# Patient Record
Sex: Female | Born: 1940 | Race: Black or African American | Hispanic: No | Marital: Married | Smoking: Never smoker
Health system: Southern US, Community
[De-identification: ages and names within clinical notes are randomized; demographics above are authoritative.]

## PROBLEM LIST (undated history)

## (undated) DIAGNOSIS — E119 Type 2 diabetes mellitus without complications: Secondary | ICD-10-CM

## (undated) DIAGNOSIS — Z972 Presence of dental prosthetic device (complete) (partial): Secondary | ICD-10-CM

## (undated) DIAGNOSIS — I10 Essential (primary) hypertension: Secondary | ICD-10-CM

## (undated) HISTORY — PX: CHOLECYSTECTOMY: SHX55

## (undated) HISTORY — PX: ABDOMINAL HYSTERECTOMY: SHX81

---

## 2005-02-08 ENCOUNTER — Emergency Department: Payer: Self-pay | Admitting: Emergency Medicine

## 2006-12-02 IMAGING — CR RIGHT ANKLE - COMPLETE 3+ VIEW
1 series · 4 of 4 positions shown · non-contrast
Comparison: none

REASON FOR EXAM: Stepped into hole, twisted ankle
COMMENTS:  LMP: Post Hysterectomy

[Series 1: view not recorded · 0.17mm/px · 4 of 4 slices shown]
[im 1/4]
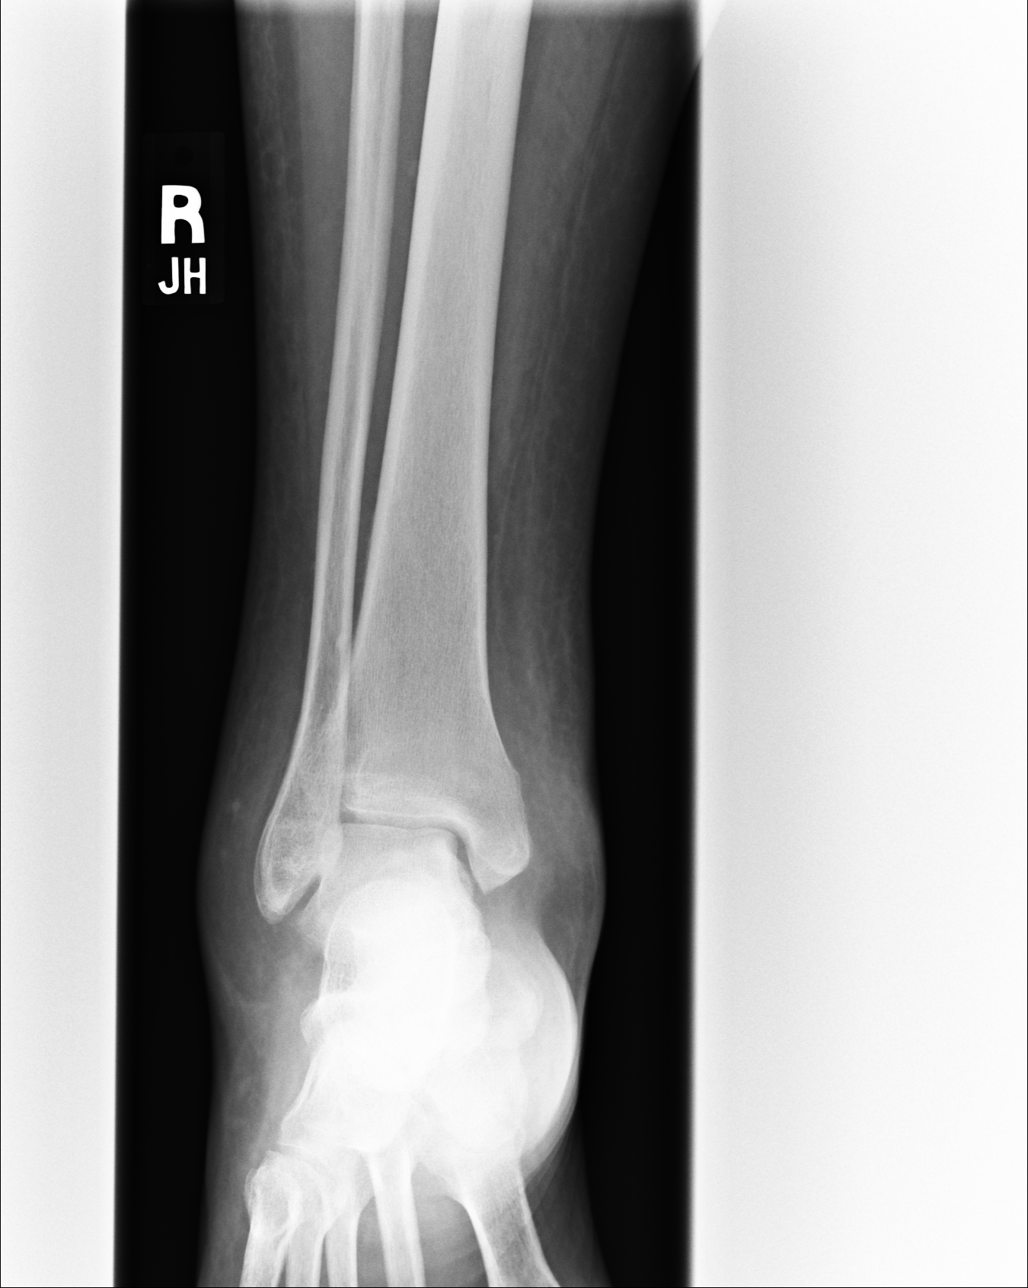
[im 2/4]
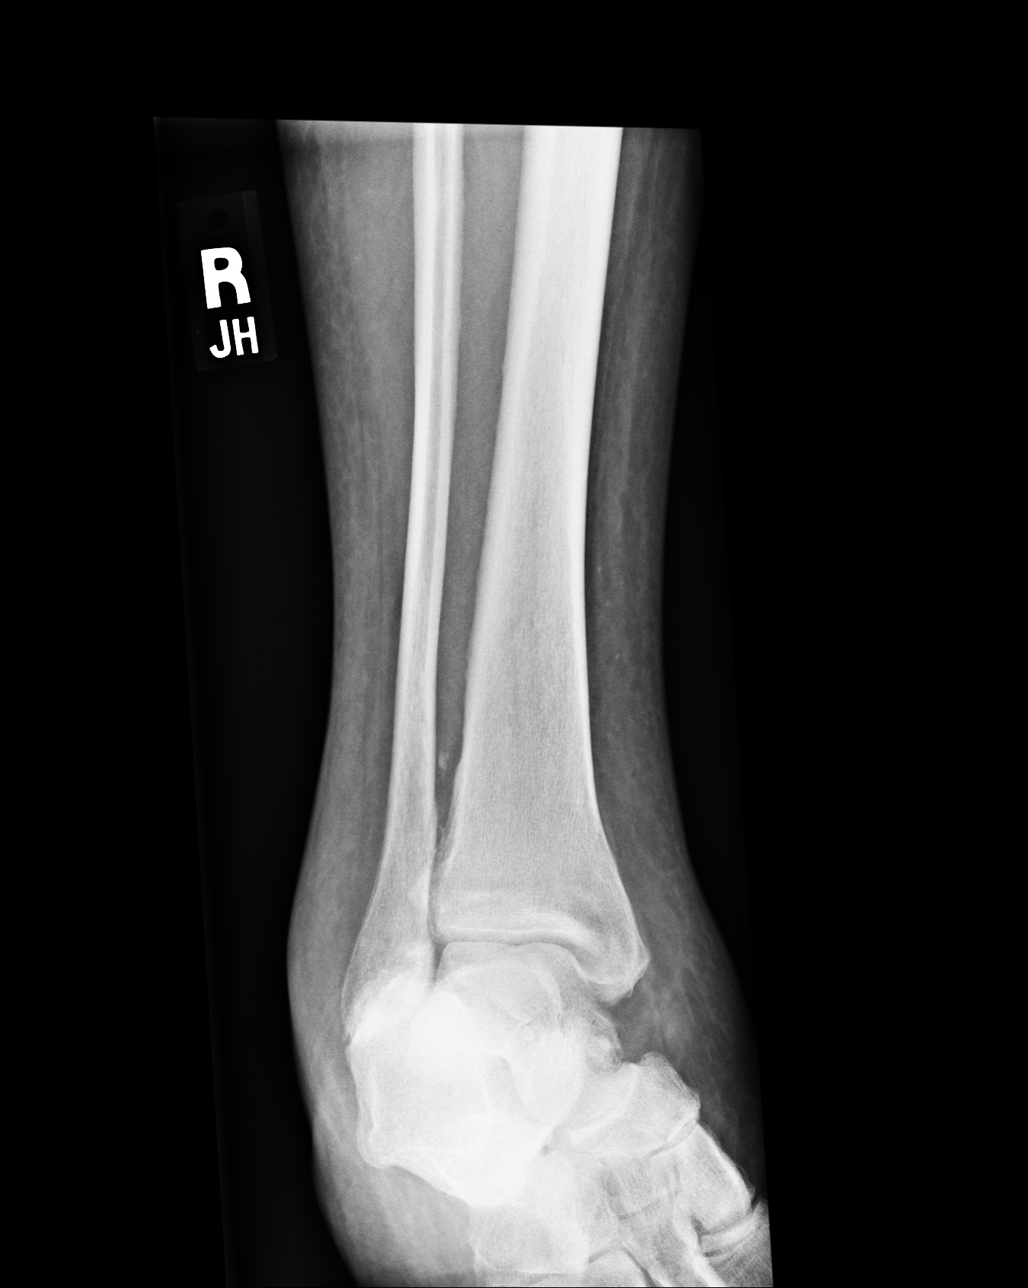
[im 3/4]
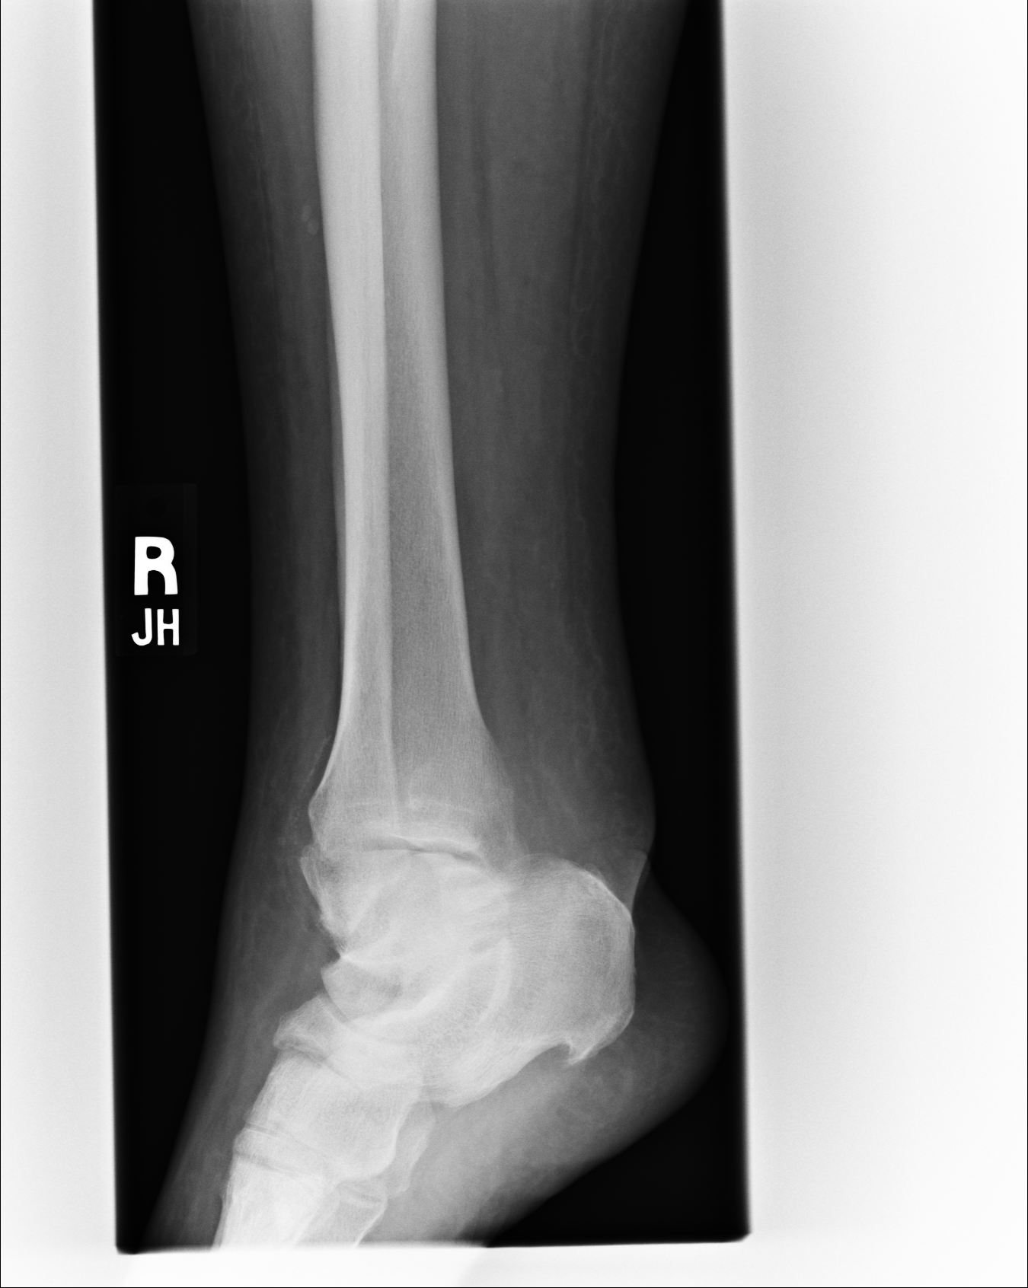
[im 4/4]
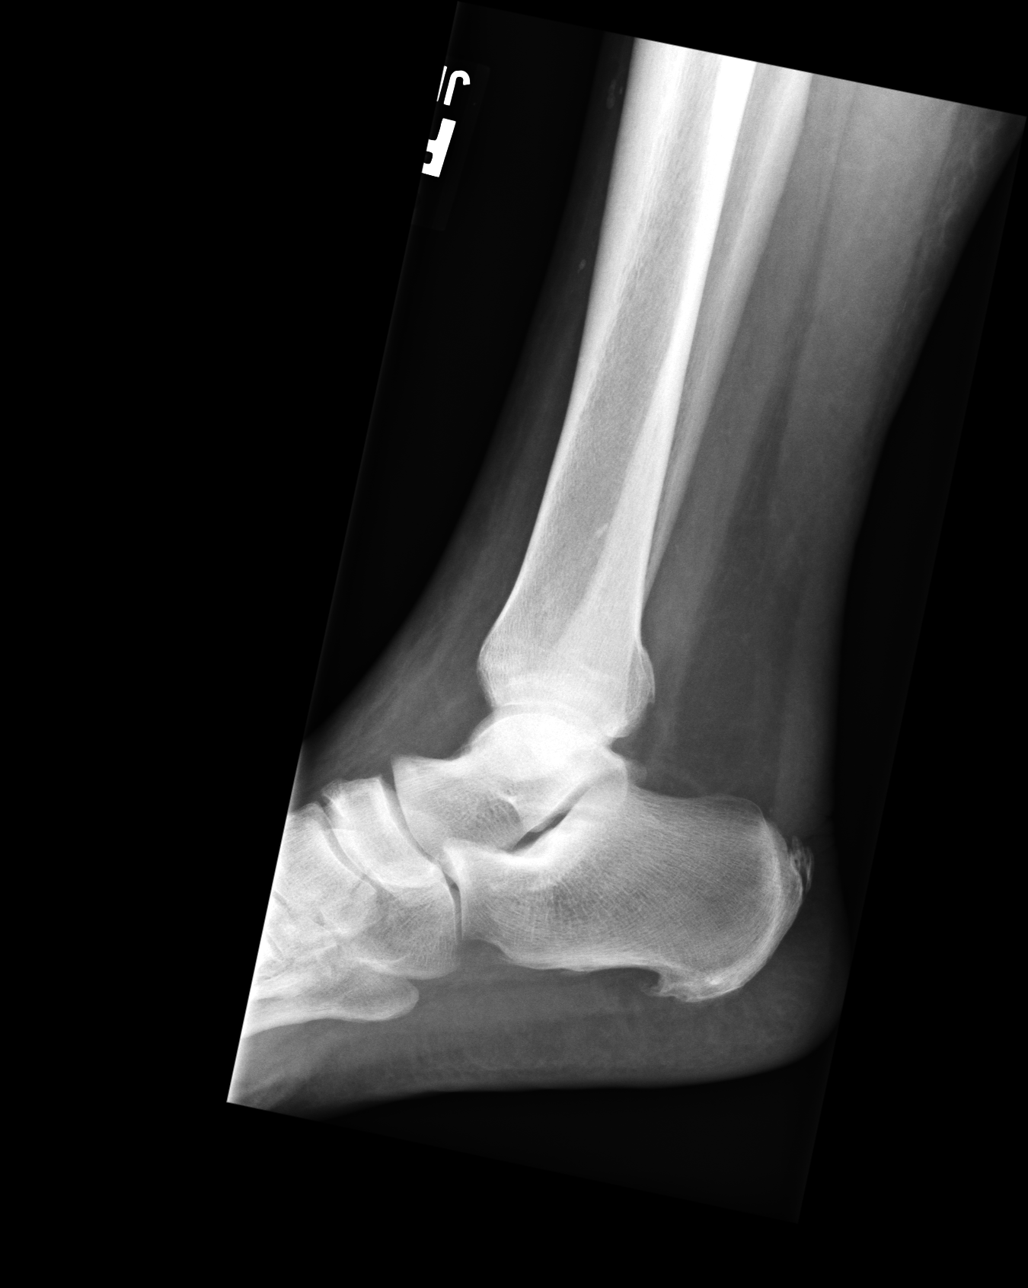

[4 of 4 positions shown; findings below may reference images not displayed]

PROCEDURE:     DXR - DXR ANKLE RIGHT COMPLETE  - February 08, 2005  [DATE]

RESULT:     Multiple views of the RIGHT ankle show significant soft tissue
swelling medially and laterally.  There is degenerative spurring at the
Achilles and plantar insertions on the calcaneus.  A definite fracture is
not identified.
IMPRESSION: As above.

## 2010-10-18 ENCOUNTER — Emergency Department: Payer: Self-pay | Admitting: Emergency Medicine

## 2012-08-10 IMAGING — CR DG CHEST 1V PORT
1 series · 1 of 1 positions shown · non-contrast
Comparison: none

REASON FOR EXAM: EPIGASTRIC PAIN
COMMENTS:

PROCEDURE:     DXR - DXR PORTABLE CHEST SINGLE VIEW  - October 18, 2010 [DATE]
RESULT:     The lungs are adequately inflated and clear. The cardiac
silhouette is normal in size. The pulmonary vascularity is not engorged.
There is no pleural effusion. The mediastinum is normal in width.

[view not recorded]
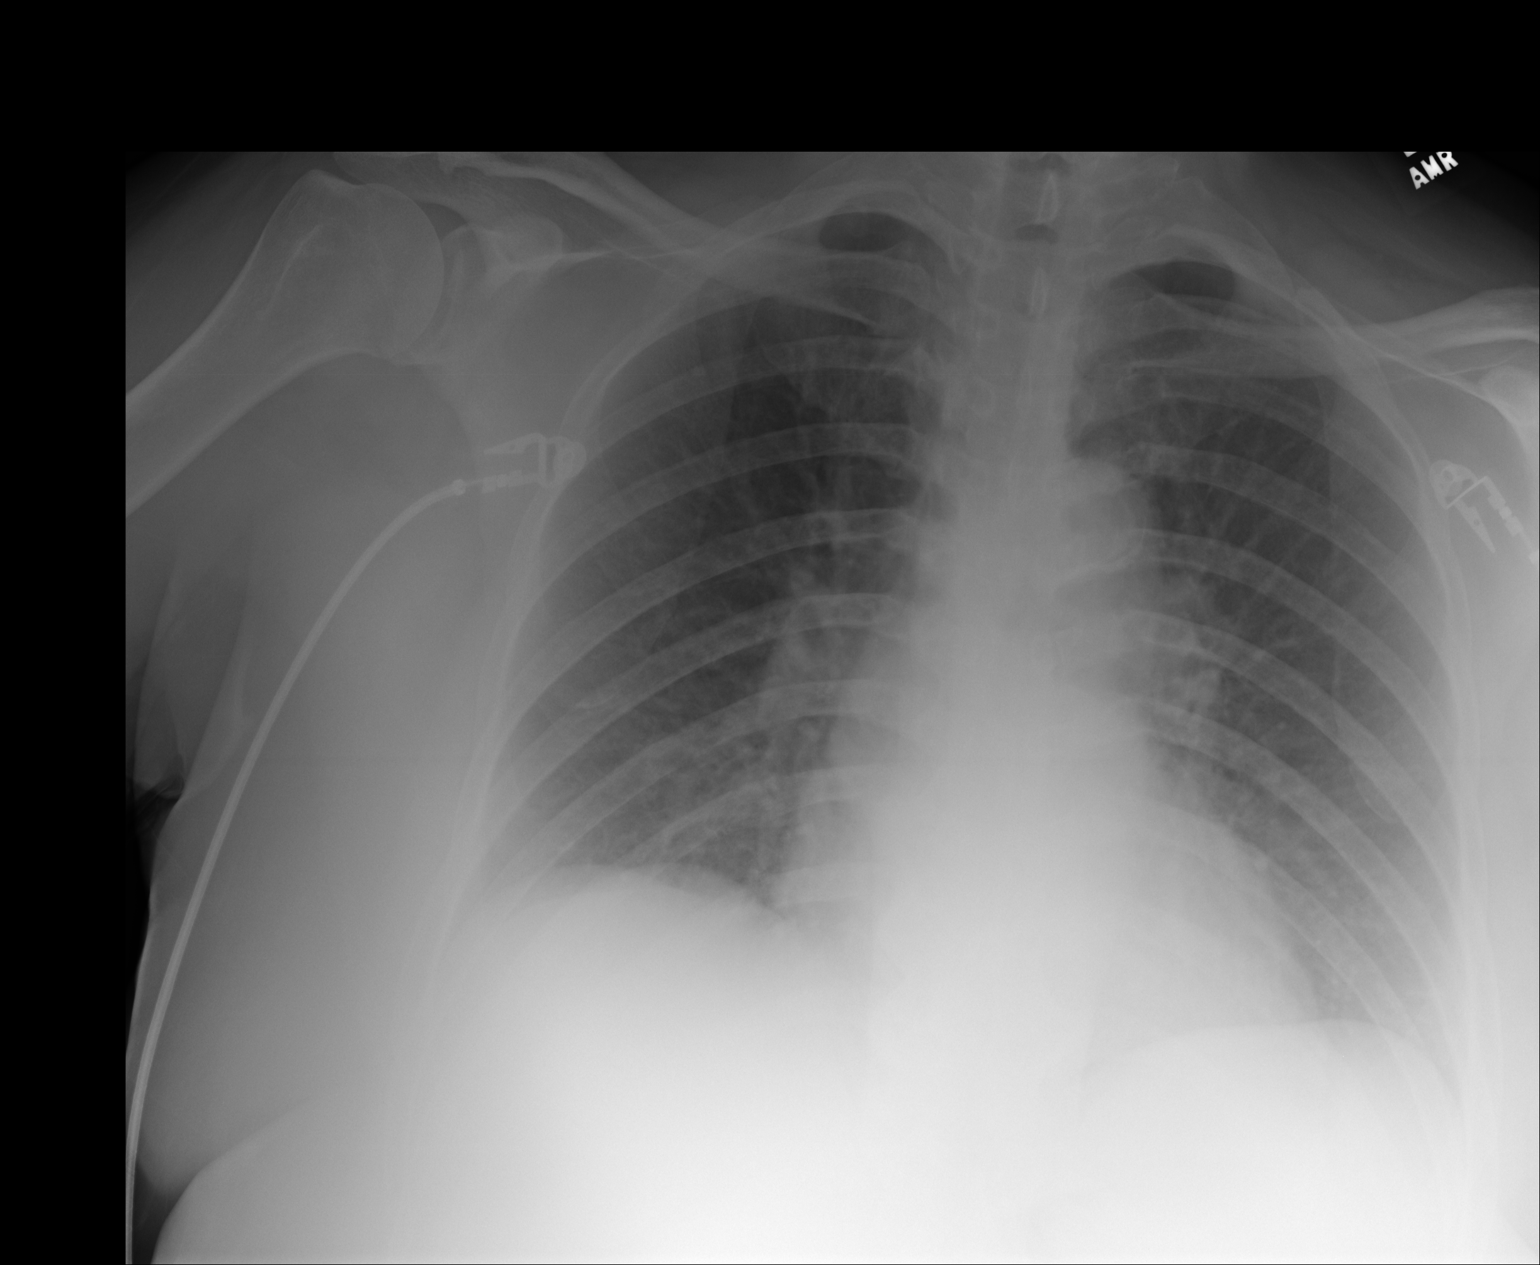

[1 of 1 positions shown; findings below may reference images not displayed]

IMPRESSION: I do not see evidence of acute cardiopulmonary abnormality.

## 2012-08-10 IMAGING — US ABDOMEN ULTRASOUND
1 series · 17 of 25 positions shown · non-contrast
Comparison: none

REASON FOR EXAM: ruq pain
COMMENTS:

[Series 1: abdomen ultrasound · 17 of 91 slices shown]
[im 1/91]
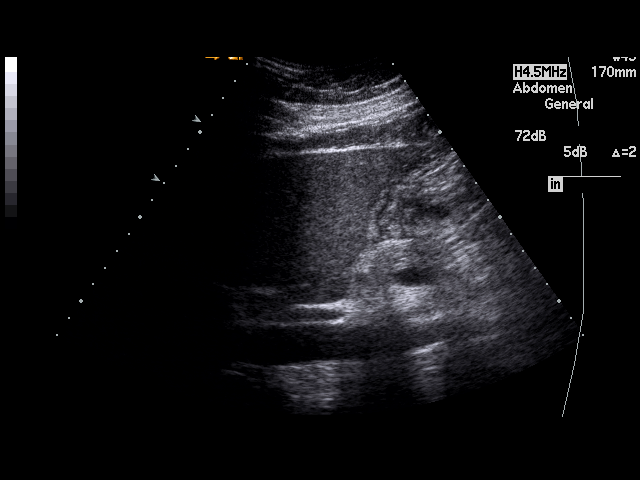
[im 8/91]
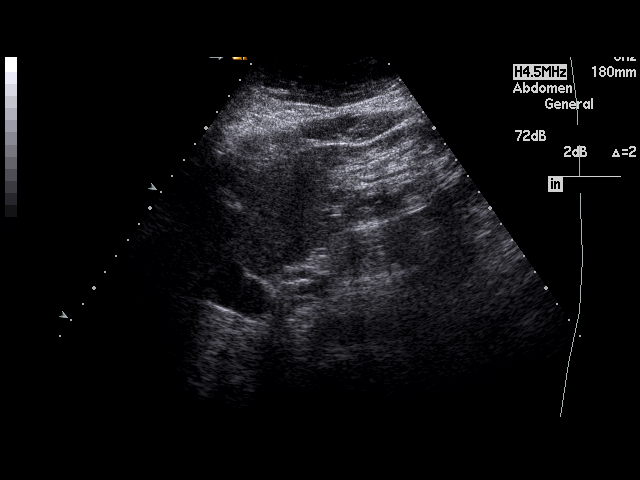
[im 12/91]
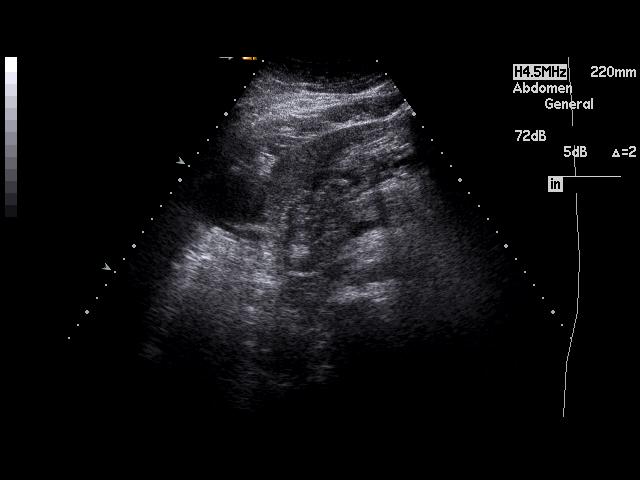
[im 19/91]
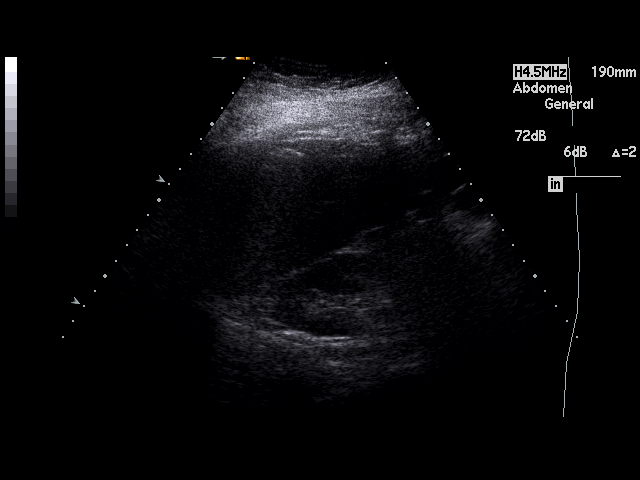
[im 23/91]
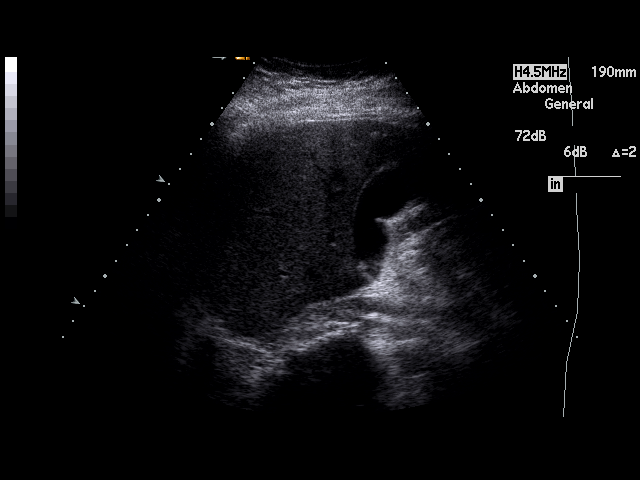
[im 31/91]
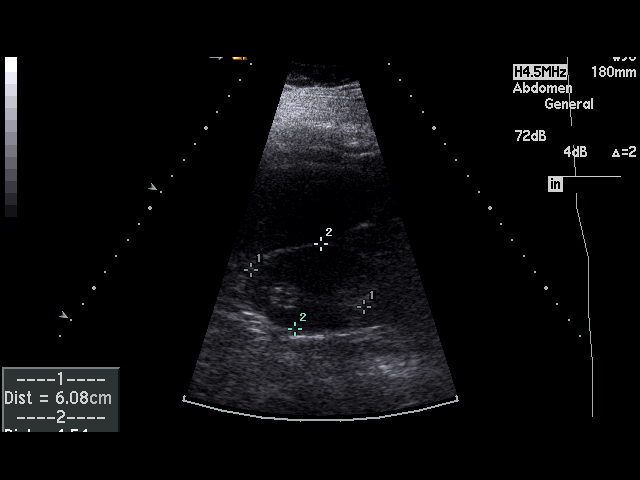
[im 34/91]
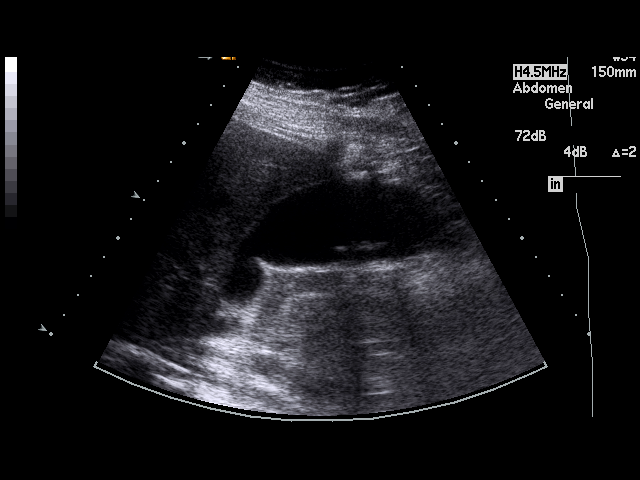
[im 42/91]
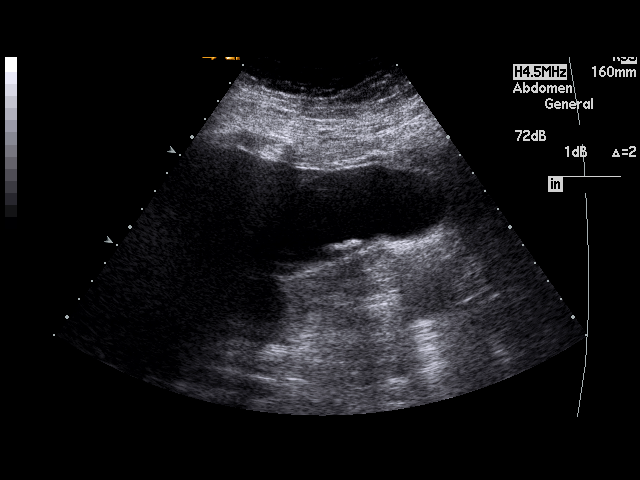
[im 46/91]
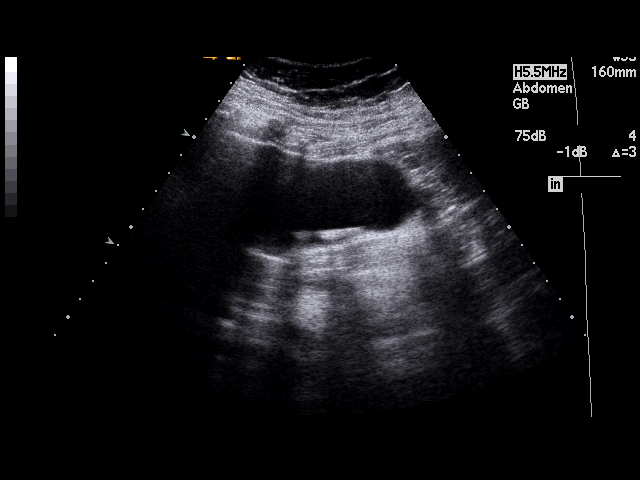
[im 49/91]
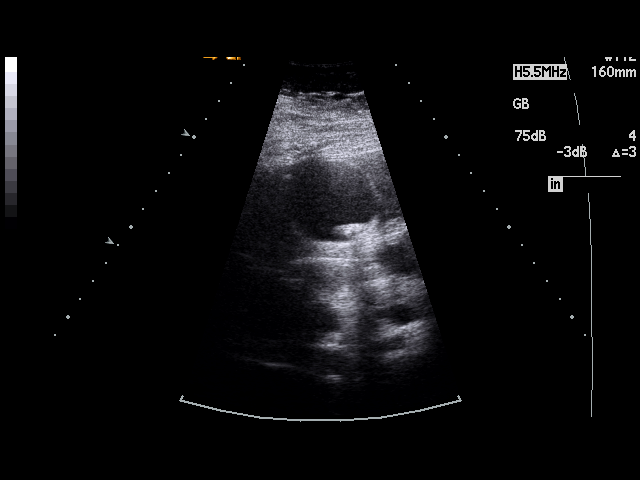
[im 57/91]
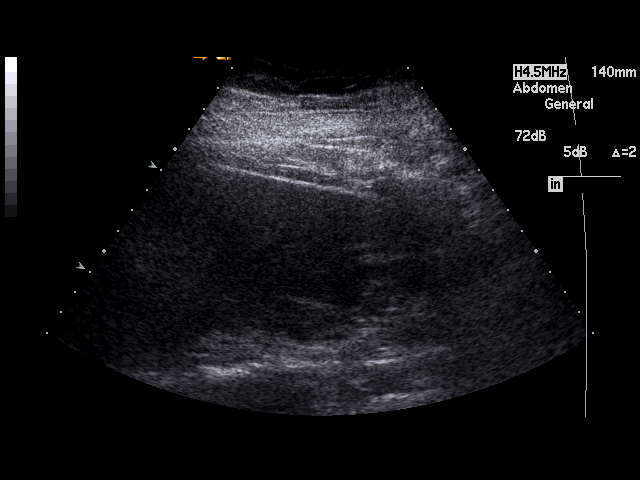
[im 61/91]
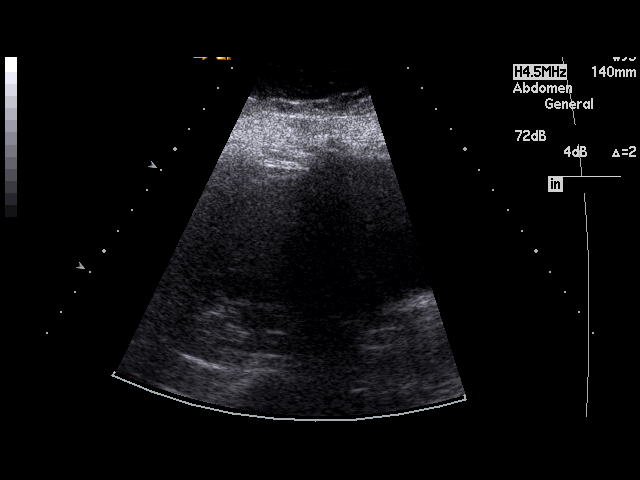
[im 68/91]
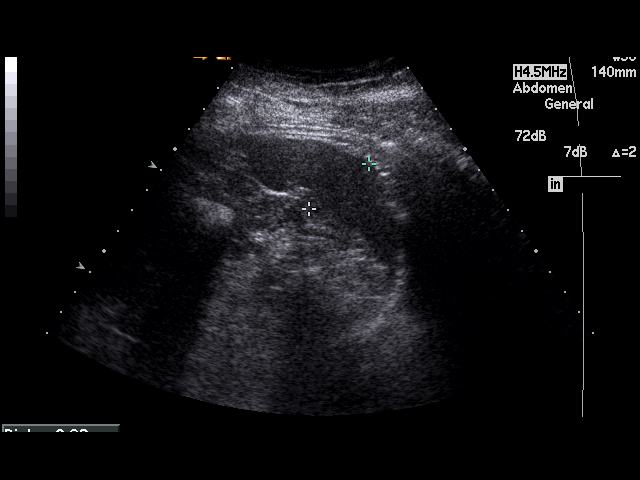
[im 72/91]
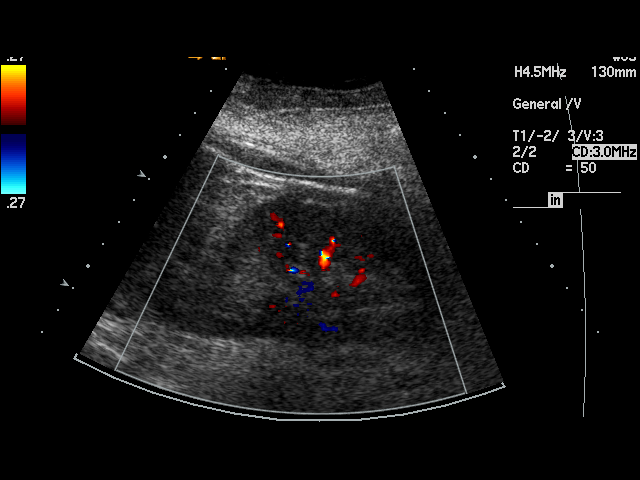
[im 79/91]
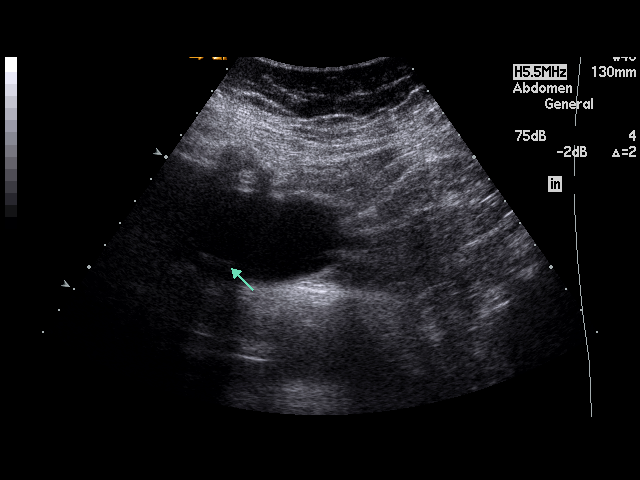
[im 83/91]
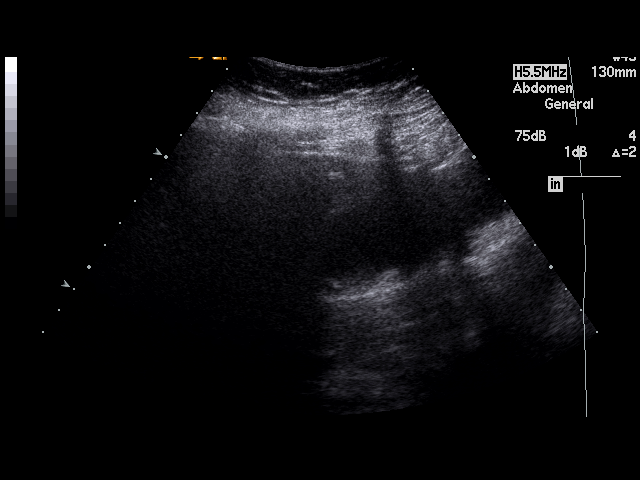
[im 91/91]
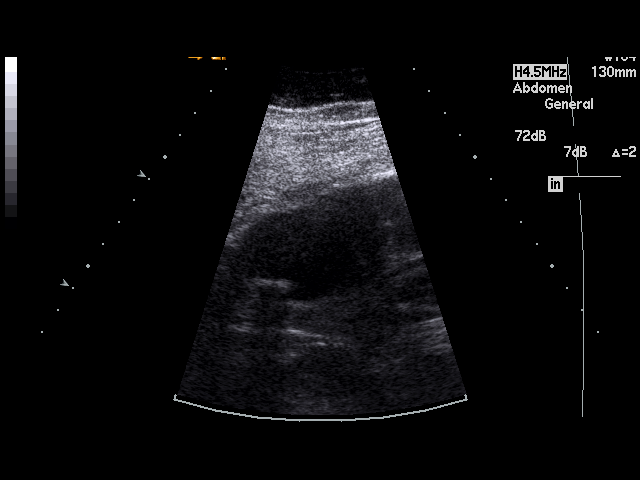

[17 of 25 positions shown; findings below may reference images not displayed]

PROCEDURE:     US  - US ABDOMEN GENERAL SURVEY  - October 18, 2010 [DATE]

RESULT:     The liver exhibits normal echotexture with no focal mass nor
ductal dilation. Portal venous flow is normal in direction toward the liver.
The gallbladder is adequately distended and contains echogenic layering
shadowing material consistent with stones or sludge. There is no positive
sonographic Murphy's sign. There is no gallbladder wall thickening or
pericholecystic fluid. The common bile duct is borderline dilated at 7.4 mm.

Evaluation the pancreas and abdominal aorta is limited due to bowel gas. The
spleen, inferior vena cava, and kidneys are normal in appearance.
IMPRESSION: 1. There are gallstones versus sludge within the gallbladder. There is no
positive sonographic Murphy's sign. The common bile duct is borderline
dilated at 7.4 mm in diameter. There is no intrahepatic ductal dilation.
2. The pancreas could not be adequately evaluated due to the presence of
bowel gas.
3. I see no acute abnormality of the liver or spleen or kidneys.

## 2013-02-25 ENCOUNTER — Ambulatory Visit: Payer: Self-pay | Admitting: Geriatric Medicine

## 2013-03-07 ENCOUNTER — Ambulatory Visit: Payer: Self-pay | Admitting: Geriatric Medicine

## 2013-04-06 ENCOUNTER — Ambulatory Visit: Payer: Self-pay | Admitting: Geriatric Medicine

## 2017-09-17 ENCOUNTER — Other Ambulatory Visit: Payer: Self-pay

## 2017-09-17 ENCOUNTER — Emergency Department: Payer: Medicare Other

## 2017-09-17 ENCOUNTER — Emergency Department
Admission: EM | Admit: 2017-09-17 | Discharge: 2017-09-17 | Disposition: A | Discharge status: Home / Self Care | Payer: Medicare Other | Attending: Emergency Medicine | Admitting: Emergency Medicine

## 2017-09-17 DIAGNOSIS — R079 Chest pain, unspecified: Secondary | ICD-10-CM | POA: Diagnosis present

## 2017-09-17 DIAGNOSIS — Z7982 Long term (current) use of aspirin: Secondary | ICD-10-CM | POA: Insufficient documentation

## 2017-09-17 DIAGNOSIS — K219 Gastro-esophageal reflux disease without esophagitis: Secondary | ICD-10-CM | POA: Diagnosis not present

## 2017-09-17 DIAGNOSIS — Z794 Long term (current) use of insulin: Secondary | ICD-10-CM | POA: Insufficient documentation

## 2017-09-17 DIAGNOSIS — Z79899 Other long term (current) drug therapy: Secondary | ICD-10-CM | POA: Diagnosis not present

## 2017-09-17 DIAGNOSIS — R739 Hyperglycemia, unspecified: Secondary | ICD-10-CM

## 2017-09-17 DIAGNOSIS — R0789 Other chest pain: Secondary | ICD-10-CM | POA: Diagnosis not present

## 2017-09-17 DIAGNOSIS — E1165 Type 2 diabetes mellitus with hyperglycemia: Secondary | ICD-10-CM | POA: Diagnosis not present

## 2017-09-17 DIAGNOSIS — I10 Essential (primary) hypertension: Secondary | ICD-10-CM | POA: Diagnosis not present

## 2017-09-17 DIAGNOSIS — R001 Bradycardia, unspecified: Secondary | ICD-10-CM | POA: Diagnosis not present

## 2017-09-17 HISTORY — DX: Type 2 diabetes mellitus without complications: E11.9

## 2017-09-17 HISTORY — DX: Essential (primary) hypertension: I10

## 2017-09-17 LAB — BASIC METABOLIC PANEL
ANION GAP: 9 (ref 5–15)
BUN: 26 mg/dL — ABNORMAL HIGH (ref 6–20)
CO2: 22 mmol/L (ref 22–32)
Calcium: 9.3 mg/dL (ref 8.9–10.3)
Chloride: 108 mmol/L (ref 101–111)
Creatinine, Ser: 1.08 mg/dL — ABNORMAL HIGH (ref 0.44–1.00)
GFR calc Af Amer: 57 mL/min — ABNORMAL LOW (ref >60)
GFR, EST NON AFRICAN AMERICAN: 49 mL/min — AB (ref >60)
GLUCOSE: 185 mg/dL — AB (ref 65–99)
POTASSIUM: 5 mmol/L (ref 3.5–5.1)
Sodium: 139 mmol/L (ref 135–145)

## 2017-09-17 LAB — HEPATIC FUNCTION PANEL
ALK PHOS: 88 U/L (ref 38–126)
ALT: 11 U/L — AB (ref 14–54)
AST: 19 U/L (ref 15–41)
Albumin: 4.3 g/dL (ref 3.5–5.0)
Bilirubin, Direct: <0.1 mg/dL — ABNORMAL LOW (ref 0.1–0.5)
TOTAL PROTEIN: 7.9 g/dL (ref 6.5–8.1)
Total Bilirubin: 0.9 mg/dL (ref 0.3–1.2)

## 2017-09-17 LAB — CBC
HEMATOCRIT: 37 % (ref 35.0–47.0)
HEMOGLOBIN: 12.1 g/dL (ref 12.0–16.0)
MCH: 28.8 pg (ref 26.0–34.0)
MCHC: 32.8 g/dL (ref 32.0–36.0)
MCV: 87.8 fL (ref 80.0–100.0)
Platelets: 187 10*3/uL (ref 150–440)
RBC: 4.22 MIL/uL (ref 3.80–5.20)
RDW: 13.9 % (ref 11.5–14.5)
WBC: 7.5 10*3/uL (ref 3.6–11.0)

## 2017-09-17 LAB — TROPONIN I: Troponin I: <0.03 ng/mL (ref <0.03)

## 2017-09-17 LAB — LIPASE, BLOOD: Lipase: 37 U/L (ref 11–51)

## 2017-09-17 MED ORDER — SODIUM CHLORIDE 0.9 % IV BOLUS (SEPSIS)
1000.0000 mL | Freq: Once | INTRAVENOUS | Status: AC
  Administered 2017-09-17: 1000 mL via INTRAVENOUS

## 2017-09-17 MED ORDER — GI COCKTAIL ~~LOC~~
30.0000 mL | Freq: Once | ORAL | Status: AC
  Administered 2017-09-17: 30 mL via ORAL
  Filled 2017-09-17: qty 30

## 2017-09-17 NOTE — Discharge Instructions (Addendum)
You are evaluated for burping and belching and abdominal/chest discomfort, and although no certain cause was found, I am most suspicious of acid reflux also called GERD.    As we discussed, your exam and evaluation are overall reassuring as far as the heart, but I am recommending that you follow-up with a cardiologist for evaluation and consideration of a possible outpatient stress test.  We also discussed, heart rate was slow with some irregularity, and after discussion with cardiologist, your placed with a monitor.  Follow-up with Dr. Windell HummingbirdGollan's office for monitor results.  Return to emergency department immediately for any worsening or uncontrolled abdominal pain, chest pain, dizziness, passing out, weakness, numbness, confusion or altered mental status, fever, black or bloody stool, or any other symptoms concerning to you.  You may try over-the-counter Maalox, use as directed for immediate symptoms.  You may try over-the-counter Prilosec 20 mg daily for 1-2 weeks to help with stomach acid.

## 2017-09-17 NOTE — ED Provider Notes (Signed)
[facility] Emergency Department Provider Note ____________________________________________   I have reviewed the triage vital signs and the triage nursing note.  HISTORY  Chief Complaint Chest Pain   Historian Patient and spouse  HPI Toni Moran is a 77 y.o. female with a history of hypertension and diabetes for which she takes amlodipine, losartan, as well as insulin, presents today with a history of indigestion, burping and belching and left under the breast discomfort which is worse with laying on to the right side since Saturday when she ate a pasta salad.  States that symptoms have been intermittent, worse on Saturday and Sunday, better but not completely gone now.  It is somewhat positional.  She has not had a fever nor cough or congestion.  No palpitations.  Pain at times is moderate.  States that she told her daughter about this and was told that she should go to be evaluated. History of cholecystectomy.   Past Medical History:  Diagnosis Date  . Diabetes mellitus without complication (HCC)   . Hypertension     There are no active problems to display for this patient.   Past Surgical History:  Procedure Laterality Date  . ABDOMINAL HYSTERECTOMY    . CHOLECYSTECTOMY      Prior to Admission medications   Medication Sig Start Date End Date Taking? Authorizing Provider  amLODipine (NORVASC) 2.5 MG tablet Take 1 tablet by mouth daily. 07/30/17  Yes [provider]  aspirin EC 81 MG tablet Take 81 mg by mouth daily.   Yes [provider]  guanFACINE (TENEX) 1 MG tablet Take 2 mg by mouth at bedtime. 08/24/17  Yes [provider]  hydrochlorothiazide (HYDRODIURIL) 25 MG tablet Take 1 tablet by mouth daily. 07/14/17  Yes [provider]  insulin glargine (LANTUS) 100 UNIT/ML injection Inject 15 Units into the skin daily.   Yes [provider]  insulin NPH Human (HUMULIN N) 100 UNIT/ML injection  Inject 45 Units into the skin daily. 03/27/16  Yes [provider]  loperamide (IMODIUM) 2 MG capsule Take by mouth as needed for diarrhea or loose stools.   Yes [provider]  losartan (COZAAR) 100 MG tablet Take 1 tablet by mouth daily. 09/05/17  Yes [provider]  metFORMIN (GLUCOPHAGE) 1000 MG tablet Take 1 tablet by mouth 2 (two) times daily. 07/12/17  Yes [provider]  simvastatin (ZOCOR) 20 MG tablet Take 1 tablet by mouth daily. 06/20/17  Yes [provider]  spironolactone (ALDACTONE) 50 MG tablet Take 1 tablet by mouth daily. 09/05/17  Yes [provider]  amlodipine 2.5mg   No Known Allergies  No family history on file.  Social History Social History   Tobacco Use  . Smoking status: Never Smoker  . Smokeless tobacco: Never Used  Substance Use Topics  . Alcohol use: No    Frequency: Never  . Drug use: No    Review of Systems  Constitutional: Negative for fever. Eyes: Negative for visual changes. ENT: Negative for sore throat. Cardiovascular: Left under the breast pain just along the rib margin, unclear if it is actually in the abdomen itself. Respiratory: Negative for shortness of breath. Gastrointestinal: Negative for abdominal pain, vomiting and diarrhea. Genitourinary: Negative for dysuria. Musculoskeletal: Negative for back pain. Skin: Negative for rash. Neurological: Negative for headache.  ____________________________________________   PHYSICAL EXAM:  VITAL SIGNS: ED Triage Vitals [09/17/17 1052]  Enc Vitals Group     BP (!) 200/47  Pulse Rate 96     Resp 18     Temp 97.7 F (36.5 C)     Temp Source Oral     SpO2 100 %     Weight 201 lb (91.2 kg)     Height 5\' 7"  (1.702 m)     Head Circumference      Peak Flow      Pain Score      Pain Loc      Pain Edu?      Excl. in GC?      Constitutional: Alert and oriented. Well appearing and in no distress. HEENT   Head: Normocephalic and  atraumatic.      Eyes: Conjunctivae are normal. Pupils equal and round.       Ears:         Nose: No congestion/rhinnorhea.   Mouth/Throat: Mucous membranes are moist.   Neck: No stridor. Cardiovascular/Chest: Normal rate, regular rhythm.  No murmurs, rubs, or gallops. Respiratory: Normal respiratory effort without tachypnea nor retractions. Breath sounds are clear and equal bilaterally. No wheezes/rales/rhonchi. Gastrointestinal: Soft. No distention, no guarding, no rebound. Nontender.    Genitourinary/rectal:Deferred Musculoskeletal: Nontender with normal range of motion in all extremities. No joint effusions.  No lower extremity tenderness. 1+ lower extremity edema bilateral lower extreme is without any calf tenderness. Neurologic:  Normal speech and language. No gross or focal neurologic deficits are appreciated. Skin:  Skin is warm, dry and intact. No rash noted. Psychiatric: Mood and affect are normal. Speech and behavior are normal. Patient exhibits appropriate insight and judgment.   ____________________________________________  LABS (pertinent positives/negatives) I, Governor Rooksebecca Jacobie Stamey, MD the attending physician have reviewed the labs noted below.  Labs Reviewed  BASIC METABOLIC PANEL - Abnormal; Notable for the following components:      Result Value   Glucose, Bld 185 (*)    BUN 26 (*)    Creatinine, Ser 1.08 (*)    GFR calc non Af Amer 49 (*)    GFR calc Af Amer 57 (*)    All other components within normal limits  HEPATIC FUNCTION PANEL - Abnormal; Notable for the following components:   ALT 11 (*)    Bilirubin, Direct <0.1 (*)    All other components within normal limits  CBC  TROPONIN I  LIPASE, BLOOD    ____________________________________________    EKG I, Governor Rooksebecca Nariah Morgano, MD, the attending physician have personally viewed and interpreted all ECGs.  46 bpm, uncertain rhythm, appears to be normal sinus rhythm with Weinckebach.  Bradycardic.  Narrow qrs.   Normal st and t wave.  Rhythm strip 12:10.  Normal sinus rhythm with an degree AV block Herbie SaxonWenke Bach.  Rhythm strip 13:0 1 normal sinus rhythm, sinus bradycardia with sinus arrhythmia  EKG #2: 3:11 19  44 bpm.  Sinus rhythm with sinus pauses, sinus arrhythmia.  Naris rest were normal axis.  Normal ST and T wave.  EKG #3  3:11 48 49 bpm.  Normal sinus rhythm, sinus bradycardia.  No acute respiratory normal axis.  Normal ST and T wave ____________________________________________  RADIOLOGY   Chest 2 view: negative Radiologist interp:  IMPRESSION: No acute cardiopulmonary disease. __________________________________________  PROCEDURES  Procedure(s) performed: None  Critical Care performed: None   ____________________________________________  ED COURSE / ASSESSMENT AND PLAN  Pertinent labs & imaging results that were available during my care of the patient were reviewed by me and considered in my medical decision making (see chart for details).   Clinically  most suspicious for gastritis/GERD.  Interestingly she does report some sort of positional component that when she is lying on her right side she can feel a pulling in her left lower chest/upper abdomen.  Uncertain etiology of this.  Does not sound cardiac.  However, given location will go ahead and evaluate with laboratory studies as well.  Patient states that she is been told that she has a low heart rate.  Her initial EKG shows likely winky block, and on the monitor in with her, it looks like just normal sinus rhythm with no irregularity.  Heart rate into the 60s in the room on the monitor.  Given her evaluation is overall reassuring, discussed with her okay for discharge home, plan to add over-the-counter Maalox for intermittent symptoms, as well as Prilosec for 1 week.  I am recommending that she follow with a cardiologist out of concern of risk factors for ACS, and nonspecific symptoms.  I had also added on hepatic  function panel and lipase which are all reassuring.  DIFFERENTIAL DIAGNOSIS: Differential diagnosis includes, but is not limited to, ACS, aortic dissection, pulmonary embolism, cardiac tamponade, pneumothorax, pneumonia, pericarditis, myocarditis, GI-related causes including esophagitis/gastritis, and musculoskeletal chest wall pain.    CONSULTATIONS:   Dr. Mariah Milling, cardiology, he reviewed the EKGs together and no indication for emergency admission, he is recommending Holter monitor and he will follow up the results and follow-up with the patient.  Again patient is asymptomatic from this perspective.   Patient / Family / Caregiver informed of clinical course, medical decision-making process, and agree with plan.   I discussed return precautions, follow-up instructions, and discharge instructions with patient and/or family.  Discharge Instructions : You are evaluated for burping and belching and abdominal/chest discomfort, and although no certain cause was found, I am most suspicious of acid reflux also called GERD.    As we discussed, your exam and evaluation are overall reassuring as far as the heart, but I am recommending that you follow-up with a cardiologist for evaluation and consideration of a possible outpatient stress test.  We also discussed, heart rate was slow with some irregularity, and after discussion with cardiologist, your placed with a monitor.  Follow-up with Dr. Windell Hummingbird office for monitor results.  Return to emergency department immediately for any worsening or uncontrolled abdominal pain, chest pain, dizziness, passing out, weakness, numbness, confusion or altered mental status, fever, black or bloody stool, or any other symptoms concerning to you.  You may try over-the-counter Maalox, use as directed for immediate symptoms.  You may try over-the-counter Prilosec 20 mg daily for 1-2 weeks to help with stomach acid.     ___________________________________________   FINAL  CLINICAL IMPRESSION(S) / ED DIAGNOSES   Final diagnoses:  Atypical chest pain  Gastroesophageal reflux disease, esophagitis presence not specified  Hyperglycemia  Essential hypertension  Bradycardia      ___________________________________________        Note: This dictation was prepared with Dragon dictation. Any transcriptional errors that result from this process are unintentional    Governor Rooks, MD 09/17/17 [phone]

## 2017-09-17 NOTE — ED Notes (Signed)
CALLED  RESPIRATORY  FOR  HOLTER  MONITOR  PER  DR  Shaune PollackLORD  MD

## 2017-09-17 NOTE — ED Triage Notes (Signed)
Pt c/o left sided chest pain since Saturday evening and states she has not been able to lay down with out the pain worsening . Denies SOB/N/v..Marland Kitchen

## 2017-09-17 NOTE — ED Notes (Signed)
Pt reports eating pasta salad a few days ago and having several episodes of belching and indigestion.  Pt reports that since she has not been able to lay flat on one side or the other without having pain under her breast.  Pt reports that she has some relief with an antacid.

## 2017-09-17 NOTE — ED Notes (Signed)
Pt discharged to home.  Family member driving.  Discharge instructions reviewed.  Verbalized understanding.  No questions or concerns at this time.  Teach back verified.  Pt in NAD.  No items left in ED.   

## 2017-09-18 ENCOUNTER — Telehealth: Payer: Self-pay

## 2017-09-18 NOTE — Telephone Encounter (Signed)
Pt calling to let us know [facility] will [facility] be needing [facility] services [facility], [facility]  Nothing else needed.

## 2017-09-24 ENCOUNTER — Ambulatory Visit (INDEPENDENT_AMBULATORY_CARE_PROVIDER_SITE_OTHER): Payer: Medicare Other

## 2017-09-24 DIAGNOSIS — R001 Bradycardia, unspecified: Secondary | ICD-10-CM

## 2017-11-10 ENCOUNTER — Telehealth: Payer: Self-pay | Admitting: *Deleted

## 2017-11-10 NOTE — Telephone Encounter (Signed)
-----   Message from Clide Dales sent at 11/09/2017  9:33 AM EDT ----- Regarding: monitor results Contact: [phone] Patient called after receiving statement for monitor placed in [facility] ER.  Dr. Mariah Milling read the results.  Patient states that she has never received a call regarding the results.  Please reach out to her regarding this.  Thank you! [facility] Billing Dept

## 2017-11-10 NOTE — Telephone Encounter (Signed)
Spoke with patient and let her know that results will be sent back to her provider and they should review with her. She was very appreciative for the call and states that she has upcoming appointment with them. She verbalized understanding with no further questions at this time.

## 2018-05-28 ENCOUNTER — Encounter: Payer: Medicare Other | Attending: Geriatric Medicine | Admitting: Dietician

## 2018-05-28 DIAGNOSIS — E119 Type 2 diabetes mellitus without complications: Secondary | ICD-10-CM | POA: Diagnosis not present

## 2018-05-28 DIAGNOSIS — Z794 Long term (current) use of insulin: Secondary | ICD-10-CM

## 2018-05-28 NOTE — Progress Notes (Signed)
Medical Nutrition Therapy: Visit start time: 1030  end time: 1145  Assessment:  Diagnosis: Type 2 diabetes Past medical history: HTN, bradycardia Psychosocial issues/ stress concerns: none  Preferred learning method:  . Auditory . Visual   Current weight: 191.1lbs Height: 5'7" Medications, supplements: reconciled list in medical record  Progress and evaluation: Patient reports some recent weight loss with effort to limit intake of sweets. She has recently reduced her potassium intake as a result of recent elevated level when she was seen for dizziness and diagnosed with acute kidney injury. She reports labs 1 week later revealed normal potassium level but she has been restricting intake regardless. She also tries to limit sodium intake.   Physical activity: water/ pool exercise 60 minutes, 0-2x a week  Dietary Intake:  Usual eating pattern includes 2-3 meals and 1-2 snacks per day. Dining out frequency: 3 meals per week.  Breakfast: instant flavored oatmeal or boiled egg(s); out 1-2x a week-- butter and sugar free jelly on biscuit with [place] sausage Snack: crackers with peanut butter or none Lunch: egg salad sandwich; sometimes skips if late breakfast Snack: sometimes corn chips or peanut butter crackers Supper: full meal-- 11/21 sandwich with rotisserie chicken, pork chop; veg ie greens, green beans, pinto beans, corn -- controls portions Snack: peanut butter crackers, or applesauce with added sugar to prevent nocturnal hypoglycemia Beverages: water, coffee with Lowella Gripruvia sweetener and sugar free creamer; occasional diet soda  Nutrition Care Education: Topics covered: diabetes Basic nutrition: basic food groups, appropriate nutrient balance, appropriate meal and snack schedule, general nutrition guidelines    Diabetes: appropriate meal and snack schedule, appropriate carb intake and identifying healthy carb choices, role of and importance of protein sources with meals, plate method for  meal planning, balanced menu options, snack options, appropriate treatment for hypoglycemia Hypertension: identifying high sodium foods, identifying food sources of sodium, potassium-- role of potassium in BP control and need to limit only if blood levels increase again.    Nutritional Diagnosis:  [place]-2.2 Altered nutrition-related laboratory As related to type 2 diabetes.  As evidenced by patient with recent HbA1C of 7.5%.  Intervention:   Instruction as noted above.  Set goals with input from patient to ensure meals and snacks at regular intervals and inclusion of protein sources to help prevent hypoglycemia.   Advised bedtime snack with small-moderate carbohydrate + protein.      Education Materials given:  . General diet guidelines for Diabetes . Plate Planner with food lsits . Sample menus . Snacking handout . Goals/ instructions   Learner/ who was taught:  . Patient   Level of understanding: Marland Kitchen. Verbalizes/ demonstrates competency   Demonstrated degree of understanding via:   Teach back Learning barriers: . None  Willingness to learn/ readiness for change: . Eager, change in progress  Monitoring and Evaluation:  Dietary intake, exercise, BG control, and body weight      follow up: 07/23/18

## 2018-05-28 NOTE — Patient Instructions (Addendum)
   Try low-sugar Quaker oatmeal or [place]odiak brand which has extra protein (picture of bear on the box)  Include a protein food with snack at bedtime to prevent low blood sugar during the night. Try 1/2 peanut butter sandwich, or a cheese slice or stick with a fruit, or graham crackers and peanut butter. Or a low-sugar Carnation breakfast Essentials drink.   Make sure to include a protein food with every meal -- add some nuts or a boiled egg with oatmeal at breakfast.   If you don't eat a meal at lunch time, include a snack that has protein like a piece of lowfat cheese, or 1/4 cup of nuts, or peanut butter.   If blood sugar drops low (less than 70):  Drink 4oz of juice or regular soda  Allow 10-15 minutes for sugar to come back to normal  If you don't feel better, retest blood sugar, and if still low, take another 4oz juice  Eat a meal or snack with protein within 30 minutes to prevent another low.

## 2018-06-16 ENCOUNTER — Telehealth: Payer: Self-pay | Admitting: Dietician

## 2018-06-16 NOTE — Telephone Encounter (Signed)
Called patient to reschedule her next appointment, scheduled for 07/23/18, due to conflict with another class. She rescheduled to 07/20/18.

## 2018-07-20 ENCOUNTER — Ambulatory Visit: Payer: Medicare Other | Admitting: Dietician

## 2018-07-20 ENCOUNTER — Telehealth: Payer: Self-pay | Admitting: Dietician

## 2018-07-20 NOTE — Telephone Encounter (Signed)
Returned IT sales professional from [place] to reschedule today's appointment as she is feeling ill. Rescheduled for 07/30/18.

## 2018-07-23 ENCOUNTER — Ambulatory Visit: Payer: Medicare Other | Admitting: Dietician

## 2018-07-30 ENCOUNTER — Encounter: Payer: Self-pay | Admitting: Dietician

## 2018-07-30 ENCOUNTER — Encounter: Payer: Medicare Other | Attending: Geriatric Medicine | Admitting: Dietician

## 2018-07-30 VITALS — Ht 67.0 in | Wt 185.0 lb

## 2018-07-30 DIAGNOSIS — Z794 Long term (current) use of insulin: Secondary | ICD-10-CM

## 2018-07-30 DIAGNOSIS — E119 Type 2 diabetes mellitus without complications: Secondary | ICD-10-CM | POA: Diagnosis present

## 2018-07-30 NOTE — Progress Notes (Signed)
Medical Nutrition Therapy: Visit start time: 1030  end time: 1115  Assessment:  Diagnosis: diabetes Medical history changes: no changes Psychosocial issues/ stress concerns: one  Current weight: 185.0lbs  Height: 5'7" Medications, supplement changes: reconciled list in medical record  Progress and evaluation: Weight loss of about 6lbs since 05/28/18. Patient has made some changes to include bedtime snack regularly to prevent nocturnal hypoglycemia. Exercise is less often due to colder weather. Patient reports recent gout flare-up in right arm, lasted several days but now better.   Physical activity: pool exercise less than once a week recently; plans to purchase mini-stationary bike for use at home  Dietary Intake:  Usual eating pattern includes 2-3 meals and 1 snacks per day. Dining out frequency: not assessed today.  Breakfast: instant oatmeal, boiled eggs; protein bar 7g sugar, lactaid milk Snack: none Lunch: sandwich with [place] or roast beef deli meat; chicken salad with crackers Snack: none Supper: chicken/ pork chop + vegetables + potato/ sweet potato, rice Snack: peanut butter crackers or applesauce; graham crackers with peanut butter Beverages: water, coffee with Lowella Grip and sugar free creamer; occasional diet soda  Nutrition Care Education: Topics covered: diabetes, gout Diabetes: appropriate meal and snack schedule, reviewed importance of protein sources with meals and bedtime snack, reviewed role of exercise Gout: guidelines for preventing flare-ups and high vs low purine foods.  Nutritional Diagnosis:  Toni Moran-2.2 Altered nutrition-related laboratory As related to type 2 diabetes.  As evidenced by patient with recent HbA1C of 7.5%.  Intervention:   Instruction and discussion as noted above.  Patient verbalizes good understanding of dietary guidelines for diabetes.  Patient has been making diet improvements and is motivated to continue.   No additional follow-up scheduled  at this time; patient will schedule later if needed.  Education Materials given:  Marland Kitchen Guidelines for Gout . Goals/ instructions  Learner/ who was taught:  . Patient   Level of understanding: Marland Kitchen Verbalizes/ demonstrates competency   Demonstrated degree of understanding via:   Teach back Learning barriers: . None  Willingness to learn/ readiness for change: . Eager, change in progress  Monitoring and Evaluation:  Dietary intake, exercise, BG control, and body weight      follow up: prn

## 2018-07-30 NOTE — Patient Instructions (Signed)
   Resume some regular exercise by trying Cubii machine or other at-home exercise.  Include some nuts or boiled egg when eating oatmeal for breakfast to get protein.   Continue to eat a snack at night with protein -- peanut butter, cheese, yogurt (light), or small amount of deli meat (ideally low sodium)

## 2019-01-13 ENCOUNTER — Other Ambulatory Visit: Payer: Self-pay | Admitting: Family Medicine

## 2019-01-13 DIAGNOSIS — Z20822 Contact with and (suspected) exposure to covid-19: Secondary | ICD-10-CM

## 2019-01-18 LAB — NOVEL CORONAVIRUS, NAA: SARS-CoV-2, NAA: NOT DETECTED

## 2019-01-21 ENCOUNTER — Telehealth: Payer: Self-pay | Admitting: General Practice

## 2019-01-21 NOTE — Telephone Encounter (Signed)
Pt called to get COVID testing results.  Let her know they were negative.

## 2019-02-19 ENCOUNTER — Other Ambulatory Visit: Payer: Self-pay | Admitting: Radiology

## 2019-02-19 DIAGNOSIS — Z20822 Contact with and (suspected) exposure to covid-19: Secondary | ICD-10-CM

## 2019-02-20 LAB — NOVEL CORONAVIRUS, NAA: SARS-CoV-2, NAA: NOT DETECTED

## 2019-02-22 ENCOUNTER — Telehealth: Payer: Self-pay | Admitting: Geriatric Medicine

## 2019-02-22 NOTE — Telephone Encounter (Signed)
Negative COVID results given. Patient results "NOT Detected." Caller expressed understanding. ° °

## 2019-06-06 ENCOUNTER — Other Ambulatory Visit: Payer: Self-pay

## 2019-06-06 DIAGNOSIS — Z20822 Contact with and (suspected) exposure to covid-19: Secondary | ICD-10-CM

## 2019-06-08 LAB — NOVEL CORONAVIRUS, NAA: SARS-CoV-2, NAA: NOT DETECTED

## 2019-07-11 IMAGING — CR DG CHEST 2V
1 series · 2 of 2 positions shown · non-contrast
Comparison: 10/18/2010.

CLINICAL DATA: Left-sided chest pain.

EXAM:
CHEST - 2 VIEW

[Series 1: dg chest 2 view · 0.14mm/px · 2 of 2 slices shown]
[im 1/2]
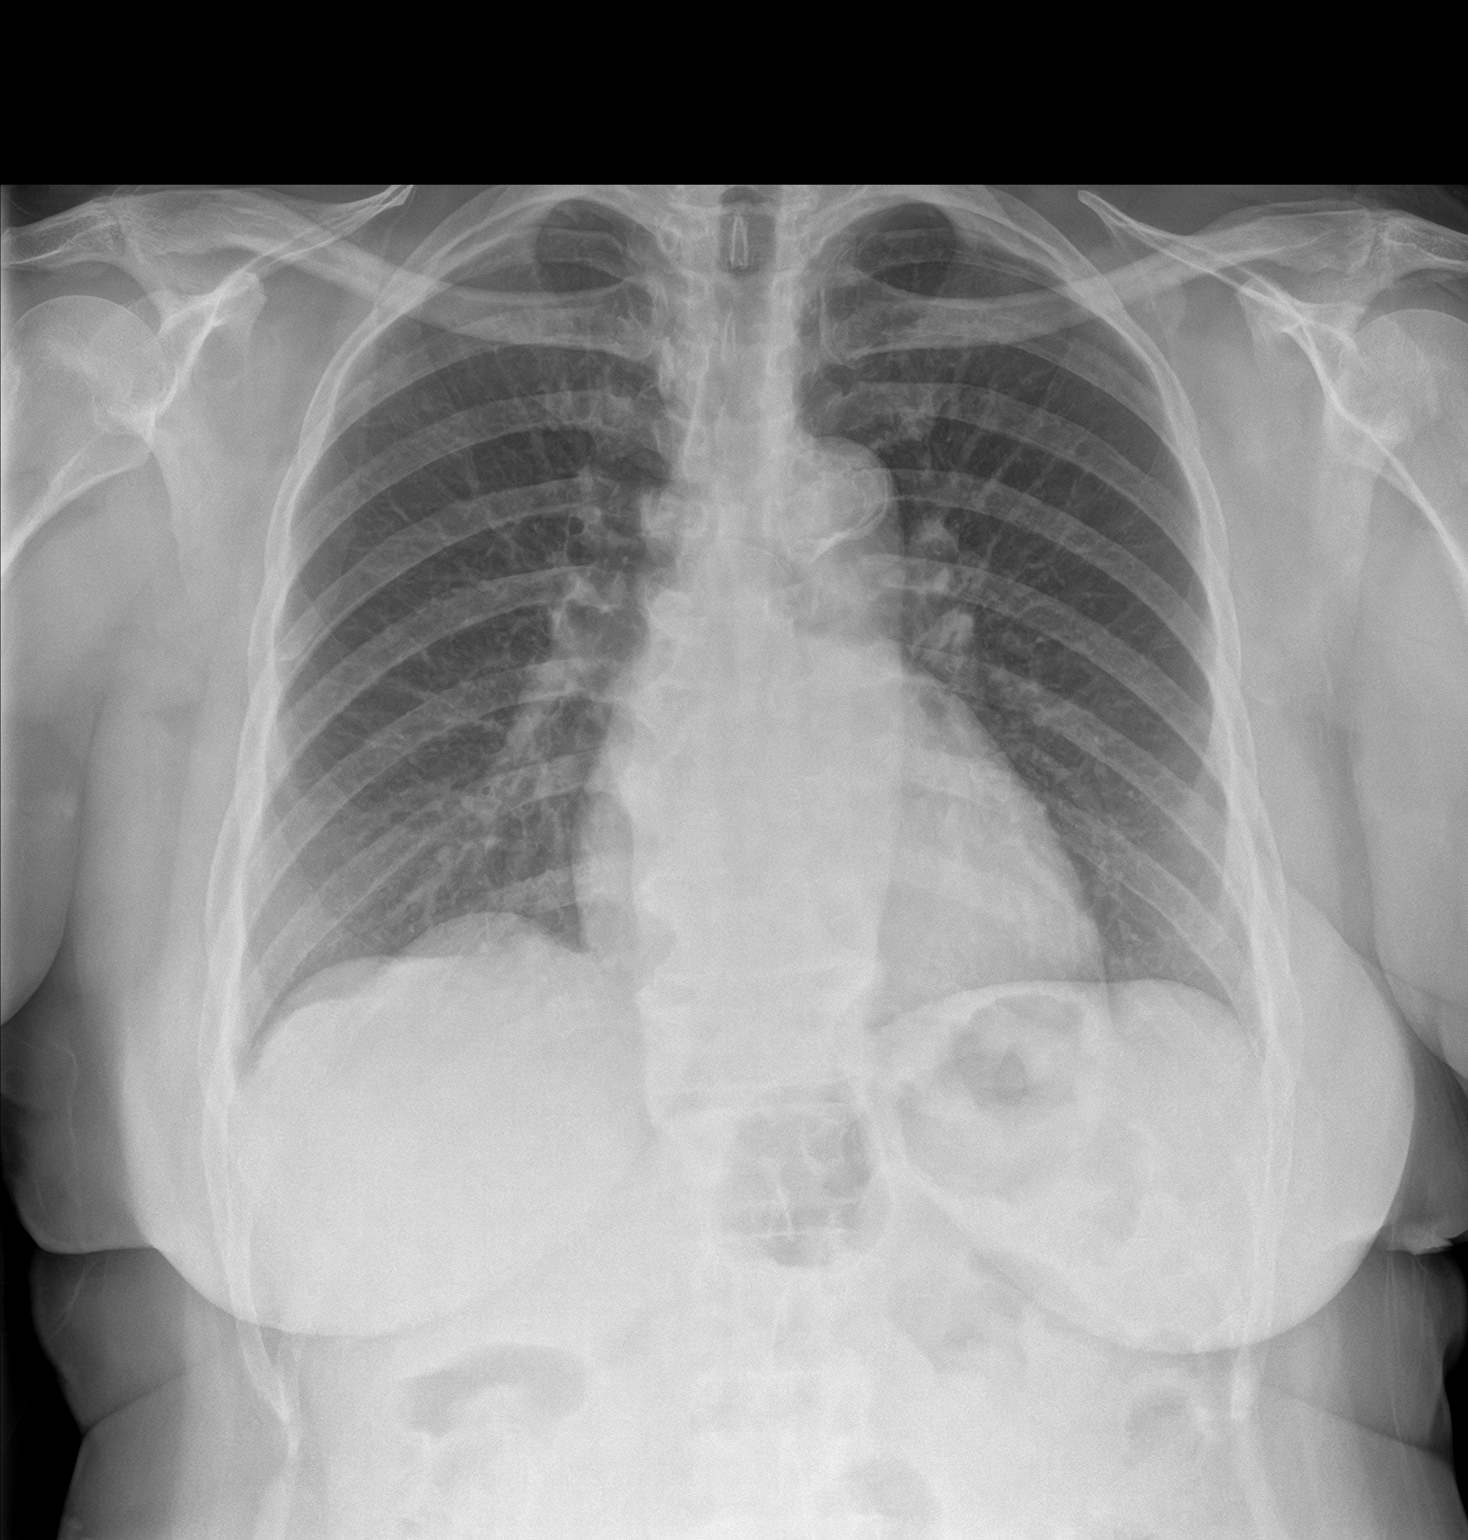
[im 2/2]
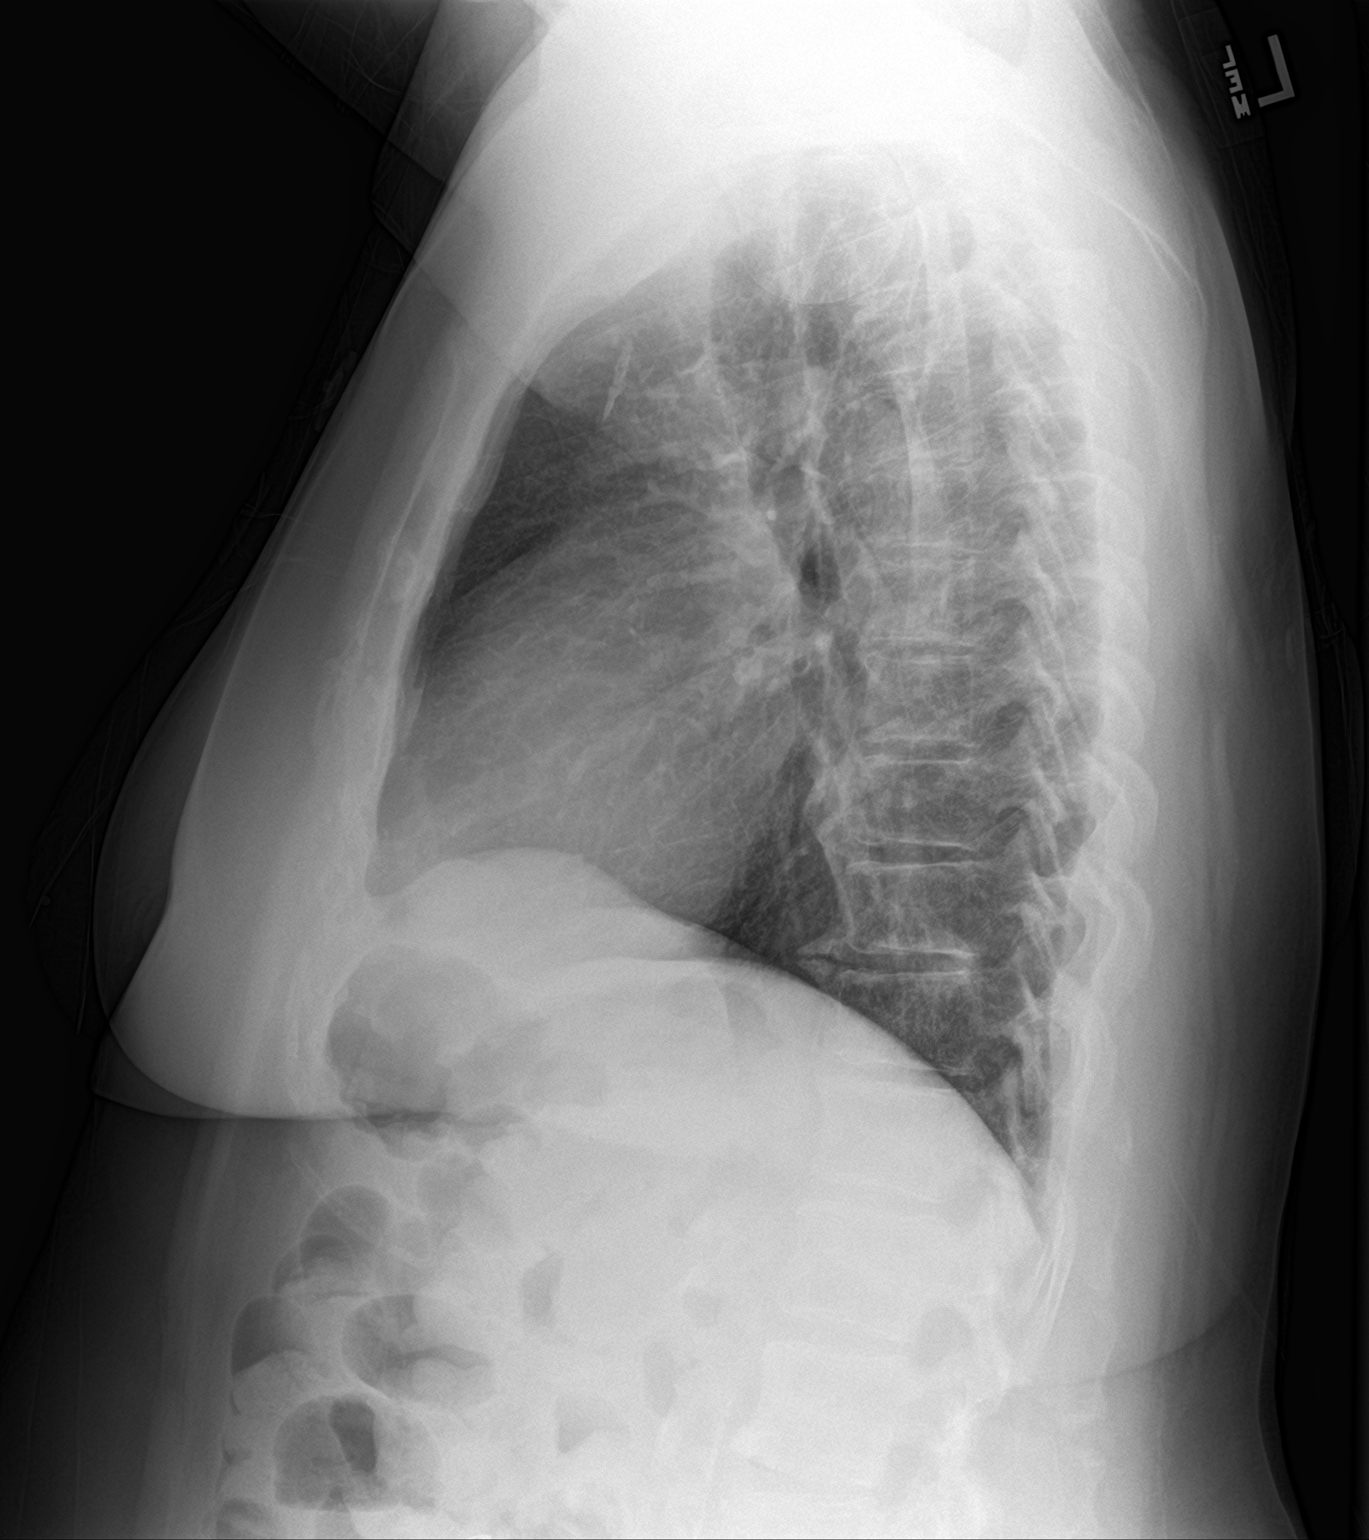

[2 of 2 positions shown; findings below may reference images not displayed]

FINDINGS: Mediastinum and hilar structures normal. Lungs are clear. No pleural
effusion or pneumothorax. Cardiomegaly with normal pulmonary
vascularity. Degenerative changes thoracic spine.
IMPRESSION: No acute cardiopulmonary disease.

## 2019-10-10 ENCOUNTER — Other Ambulatory Visit: Payer: Self-pay

## 2019-10-10 ENCOUNTER — Encounter: Payer: Self-pay | Admitting: Ophthalmology

## 2019-10-13 ENCOUNTER — Other Ambulatory Visit
Admission: RE | Admit: 2019-10-13 | Discharge: 2019-10-13 | Disposition: A | Discharge status: Home / Self Care | Payer: Medicare Other | Source: Ambulatory Visit | Attending: Ophthalmology | Admitting: Ophthalmology

## 2019-10-13 DIAGNOSIS — Z01812 Encounter for preprocedural laboratory examination: Secondary | ICD-10-CM | POA: Insufficient documentation

## 2019-10-13 DIAGNOSIS — Z20822 Contact with and (suspected) exposure to covid-19: Secondary | ICD-10-CM | POA: Diagnosis not present

## 2019-10-13 LAB — SARS CORONAVIRUS 2 (TAT 6-24 HRS): SARS Coronavirus 2: NEGATIVE

## 2019-10-13 NOTE — Discharge Instructions (Signed)

## 2019-10-17 ENCOUNTER — Encounter
Admission: RE | Disposition: A | Discharge status: Home / Self Care | Payer: Self-pay | Source: Home / Self Care | Attending: Ophthalmology

## 2019-10-17 ENCOUNTER — Ambulatory Visit
Admission: RE | Admit: 2019-10-17 | Discharge: 2019-10-17 | Disposition: A | Discharge status: Home / Self Care | Payer: Medicare Other | Attending: Ophthalmology | Admitting: Ophthalmology

## 2019-10-17 ENCOUNTER — Encounter: Payer: Self-pay | Admitting: Ophthalmology

## 2019-10-17 ENCOUNTER — Ambulatory Visit: Payer: Medicare Other | Admitting: Anesthesiology

## 2019-10-17 ENCOUNTER — Other Ambulatory Visit: Payer: Self-pay

## 2019-10-17 DIAGNOSIS — Z90710 Acquired absence of both cervix and uterus: Secondary | ICD-10-CM | POA: Diagnosis not present

## 2019-10-17 DIAGNOSIS — R011 Cardiac murmur, unspecified: Secondary | ICD-10-CM | POA: Diagnosis not present

## 2019-10-17 DIAGNOSIS — E1136 Type 2 diabetes mellitus with diabetic cataract: Secondary | ICD-10-CM | POA: Insufficient documentation

## 2019-10-17 DIAGNOSIS — Z9049 Acquired absence of other specified parts of digestive tract: Secondary | ICD-10-CM | POA: Diagnosis not present

## 2019-10-17 DIAGNOSIS — H2511 Age-related nuclear cataract, right eye: Secondary | ICD-10-CM | POA: Insufficient documentation

## 2019-10-17 DIAGNOSIS — I10 Essential (primary) hypertension: Secondary | ICD-10-CM | POA: Insufficient documentation

## 2019-10-17 DIAGNOSIS — M109 Gout, unspecified: Secondary | ICD-10-CM | POA: Diagnosis not present

## 2019-10-17 DIAGNOSIS — Z794 Long term (current) use of insulin: Secondary | ICD-10-CM | POA: Diagnosis not present

## 2019-10-17 DIAGNOSIS — D649 Anemia, unspecified: Secondary | ICD-10-CM | POA: Insufficient documentation

## 2019-10-17 HISTORY — DX: Presence of dental prosthetic device (complete) (partial): Z97.2

## 2019-10-17 HISTORY — PX: CATARACT EXTRACTION W/PHACO: SHX586

## 2019-10-17 LAB — GLUCOSE, CAPILLARY
Glucose-Capillary: 138 mg/dL — ABNORMAL HIGH (ref 70–99)
Glucose-Capillary: 127 mg/dL — ABNORMAL HIGH (ref 70–99)

## 2019-10-17 SURGERY — PHACOEMULSIFICATION, CATARACT, WITH IOL INSERTION
Anesthesia: Monitor Anesthesia Care | Site: Eye | Laterality: Right

## 2019-10-17 MED ORDER — ARMC OPHTHALMIC DILATING DROPS
1.0000 "application " | OPHTHALMIC | Status: DC | PRN
  Administered 2019-10-17 (×3): 1 via OPHTHALMIC

## 2019-10-17 MED ORDER — ACETAMINOPHEN 325 MG PO TABS: 325.0000 mg | ORAL_TABLET | Freq: Once | ORAL | Status: DC

## 2019-10-17 MED ORDER — MIDAZOLAM HCL 2 MG/2ML IJ SOLN: INTRAMUSCULAR | Status: DC | PRN

## 2019-10-17 MED ORDER — LACTATED RINGERS IV SOLN: 10.0000 mL/h | INTRAVENOUS | Status: DC

## 2019-10-17 MED ORDER — FENTANYL CITRATE (PF) 100 MCG/2ML IJ SOLN
INTRAMUSCULAR | Status: DC | PRN
  Administered 2019-10-17: 50 ug via INTRAVENOUS

## 2019-10-17 MED ORDER — EPINEPHRINE PF 1 MG/ML IJ SOLN
INTRAOCULAR | Status: DC | PRN
  Administered 2019-10-17: 08:00:00 83 mL via OPHTHALMIC

## 2019-10-17 MED ORDER — TETRACAINE HCL 0.5 % OP SOLN
1.0000 [drp] | OPHTHALMIC | Status: DC | PRN
  Administered 2019-10-17 (×3): 1 [drp] via OPHTHALMIC

## 2019-10-17 MED ORDER — SODIUM HYALURONATE 10 MG/ML IO SOLN
INTRAOCULAR | Status: DC | PRN
  Administered 2019-10-17: 0.55 mL via INTRAOCULAR

## 2019-10-17 MED ORDER — MOXIFLOXACIN HCL 0.5 % OP SOLN
OPHTHALMIC | Status: DC | PRN
  Administered 2019-10-17: 0.2 mL via OPHTHALMIC

## 2019-10-17 MED ORDER — SODIUM HYALURONATE 23 MG/ML IO SOLN
INTRAOCULAR | Status: DC | PRN
  Administered 2019-10-17: 0.6 mL via INTRAOCULAR

## 2019-10-17 MED ORDER — MIDAZOLAM HCL 2 MG/2ML IJ SOLN
INTRAMUSCULAR | Status: DC | PRN
  Administered 2019-10-17 (×2): 1 mg via INTRAVENOUS

## 2019-10-17 MED ORDER — LACTATED RINGERS IV SOLN: INTRAVENOUS | Status: DC

## 2019-10-17 MED ORDER — LIDOCAINE HCL (PF) 2 % IJ SOLN
INTRAOCULAR | Status: DC | PRN
  Administered 2019-10-17: 1 mL via INTRAOCULAR

## 2019-10-17 MED ORDER — ACETAMINOPHEN 160 MG/5ML PO SOLN: 325.0000 mg | Freq: Once | ORAL | Status: DC

## 2019-10-17 SURGICAL SUPPLY — 19 items
CANNULA ANT/CHMB 27G (MISCELLANEOUS) ×2 IMPLANT
CANNULA ANT/CHMB 27GA (MISCELLANEOUS) ×6 IMPLANT
DISSECTOR HYDRO NUCLEUS 50X22 (MISCELLANEOUS) ×3 IMPLANT
GLOVE SURG LX 7.5 STRW (GLOVE) ×2
GLOVE SURG LX STRL 7.5 STRW (GLOVE) ×1 IMPLANT
GLOVE SURG SYN 8.5  E (GLOVE) ×2
GLOVE SURG SYN 8.5 E (GLOVE) ×1 IMPLANT
GLOVE SURG SYN 8.5 PF PI (GLOVE) ×1 IMPLANT
GOWN STRL REUS W/ TWL LRG LVL3 (GOWN DISPOSABLE) ×2 IMPLANT
GOWN STRL REUS W/TWL LRG LVL3 (GOWN DISPOSABLE) ×4
LENS IOL TECNIS ITEC 24.5 (Intraocular Lens) ×2 IMPLANT
MARKER SKIN DUAL TIP RULER LAB (MISCELLANEOUS) ×3 IMPLANT
PACK DR. KING ARMS (PACKS) ×3 IMPLANT
PACK EYE AFTER SURG (MISCELLANEOUS) ×3 IMPLANT
PACK OPTHALMIC (MISCELLANEOUS) ×3 IMPLANT
SYR 3ML LL SCALE MARK (SYRINGE) ×3 IMPLANT
SYR TB 1ML LUER SLIP (SYRINGE) ×3 IMPLANT
WATER STERILE IRR 250ML POUR (IV SOLUTION) ×3 IMPLANT
WIPE NON LINTING 3.25X3.25 (MISCELLANEOUS) ×3 IMPLANT

## 2019-10-17 NOTE — H&P (Signed)

## 2019-10-17 NOTE — Op Note (Signed)
OPERATIVE NOTE  Toni Moran [id number] 10/17/2019   PREOPERATIVE DIAGNOSIS:  Nuclear sclerotic cataract right eye.  H25.11   POSTOPERATIVE DIAGNOSIS:    Nuclear sclerotic cataract right eye.     PROCEDURE:  Phacoemusification with posterior chamber intraocular lens placement of the right eye   LENS:   Implant Name Type Inv. Item Serial No. Manufacturer Lot No. LRB No. Used Action  LENS IOL DIOP 24.5 - [id number] Intraocular Lens LENS IOL DIOP 24.5 [id number]  Right 1 Implanted       Procedure(s) with comments: CATARACT EXTRACTION PHACO AND INTRAOCULAR LENS PLACEMENT (IOC) RIGHT DIABETIC 2.60  00:33.1 (Right) - Diabetic - insulin and oral meds  PCB00 +24.5   ULTRASOUND TIME: 0 minutes 33 seconds.  CDE 2.60   SURGEON:  Willey Blade, MD, MPH  ANESTHESIOLOGIST: No anesthesia staff entered.   ANESTHESIA:  Topical with tetracaine drops augmented with 1% preservative-free intracameral lidocaine.  ESTIMATED BLOOD LOSS: less than 1 mL.   COMPLICATIONS:  None.   DESCRIPTION OF PROCEDURE:  The patient was identified in the holding room and transported to the operating room and placed in the supine position under the operating microscope.  The right eye was identified as the operative eye and it was prepped and draped in the usual sterile ophthalmic fashion.   A 1.0 millimeter clear-corneal paracentesis was made at the 10:30 position. 0.5 ml of preservative-free 1% lidocaine with epinephrine was injected into the anterior chamber.  The anterior chamber was filled with Healon 5 viscoelastic.  A 2.4 millimeter keratome was used to make a near-clear corneal incision at the 8:00 position.  A curvilinear capsulorrhexis was made with a cystotome and capsulorrhexis forceps.  Balanced salt solution was used to hydrodissect and hydrodelineate the nucleus.   Phacoemulsification was then used in stop and chop fashion to remove the lens nucleus and epinucleus.  The remaining cortex was then  removed using the irrigation and aspiration handpiece. Healon was then placed into the capsular bag to distend it for lens placement.  A lens was then injected into the capsular bag.  The remaining viscoelastic was aspirated.   Wounds were hydrated with balanced salt solution.  The anterior chamber was inflated to a physiologic pressure with balanced salt solution.   Intracameral vigamox 0.1 mL undiluted was injected into the eye and a drop placed onto the ocular surface.  No wound leaks were noted.  The patient was taken to the recovery room in stable condition without complications of anesthesia or surgery  Willey Blade 10/17/2019, 7:54 AM

## 2019-10-17 NOTE — Anesthesia Postprocedure Evaluation (Signed)
Anesthesia Post Note  Patient: Toni Moran  Procedure(s) Performed: CATARACT EXTRACTION PHACO AND INTRAOCULAR LENS PLACEMENT (IOC) RIGHT DIABETIC 2.60  00:33.1 (Right Eye)     Patient location during evaluation: PACU Anesthesia Type: MAC Level of consciousness: awake and alert and oriented Pain management: satisfactory to patient Vital Signs Assessment: post-procedure vital signs reviewed and stable Respiratory status: spontaneous breathing, nonlabored ventilation and respiratory function stable Cardiovascular status: blood pressure returned to baseline and stable Postop Assessment: Adequate PO intake and No signs of nausea or vomiting Anesthetic complications: no    [name]

## 2019-10-17 NOTE — Anesthesia Preprocedure Evaluation (Signed)
Anesthesia Evaluation  Patient identified by MRN, date of birth, ID band Patient awake    Reviewed: Allergy & Precautions, H&P , NPO status , Patient's Chart, lab work & pertinent test results  Airway Mallampati: III  TM Distance: >3 FB Neck ROM: full    Dental no notable dental hx. (+) Partial Upper, Partial Lower   Pulmonary    Pulmonary exam normal breath sounds clear to auscultation       Cardiovascular hypertension,  Rhythm:regular Rate:Normal + Systolic murmurs    Neuro/Psych    GI/Hepatic   Endo/Other  diabetes, Insulin Dependent, Oral Hypoglycemic Agents  Renal/GU      Musculoskeletal   Abdominal   Peds  Hematology   Anesthesia Other Findings   Reproductive/Obstetrics                             Anesthesia Physical Anesthesia Plan  ASA: II  Anesthesia Plan: MAC   Post-op Pain Management:    Induction:   PONV Risk Score and Plan: 2 and Treatment may vary due to age or medical condition, TIVA and Midazolam  Airway Management Planned:   Additional Equipment:   Intra-op Plan:   Post-operative Plan:   Informed Consent: I have reviewed the patients History and Physical, chart, labs and discussed the procedure including the risks, benefits and alternatives for the proposed anesthesia with the patient or authorized representative who has indicated his/her understanding and acceptance.     Dental Advisory Given  Plan Discussed with: CRNA  Anesthesia Plan Comments:         Anesthesia Quick Evaluation

## 2019-10-17 NOTE — Anesthesia Procedure Notes (Signed)
Procedure Name: MAC Date/Time: 10/17/2019 7:32 AM Performed by: Georga Bora, CRNA Pre-anesthesia Checklist: Emergency Drugs available, Patient identified, Suction available, Patient being monitored and Timeout performed Patient Re-evaluated:Patient Re-evaluated prior to induction Oxygen Delivery Method: Nasal cannula

## 2019-10-17 NOTE — Transfer of Care (Signed)
Immediate Anesthesia Transfer of Care Note  Patient: Toni Moran  Procedure(s) Performed: CATARACT EXTRACTION PHACO AND INTRAOCULAR LENS PLACEMENT (IOC) RIGHT DIABETIC 2.60  00:33.1 (Right Eye)  Patient Location: PACU  Anesthesia Type: MAC  Level of Consciousness: awake, alert  and patient cooperative  Airway and Oxygen Therapy: Patient Spontanous Breathing and Patient connected to supplemental oxygen  Post-op Assessment: Post-op Vital signs reviewed, Patient's Cardiovascular Status Stable, Respiratory Function Stable, Patent Airway and No signs of Nausea or vomiting  Post-op Vital Signs: Reviewed and stable  Complications: No apparent anesthesia complications

## 2019-10-18 ENCOUNTER — Encounter: Payer: Self-pay | Admitting: *Deleted

## 2019-11-17 ENCOUNTER — Encounter: Payer: Self-pay | Admitting: Ophthalmology

## 2019-11-24 ENCOUNTER — Other Ambulatory Visit
Admission: RE | Admit: 2019-11-24 | Discharge: 2019-11-24 | Disposition: A | Discharge status: Home / Self Care | Payer: Medicare Other | Source: Ambulatory Visit | Attending: Ophthalmology | Admitting: Ophthalmology

## 2019-11-24 ENCOUNTER — Other Ambulatory Visit: Payer: Self-pay

## 2019-11-24 DIAGNOSIS — Z01812 Encounter for preprocedural laboratory examination: Secondary | ICD-10-CM | POA: Insufficient documentation

## 2019-11-24 DIAGNOSIS — Z20822 Contact with and (suspected) exposure to covid-19: Secondary | ICD-10-CM | POA: Diagnosis not present

## 2019-11-24 LAB — SARS CORONAVIRUS 2 (TAT 6-24 HRS): SARS Coronavirus 2: NEGATIVE

## 2019-11-24 NOTE — Discharge Instructions (Signed)

## 2019-11-28 ENCOUNTER — Encounter
Admission: RE | Disposition: A | Discharge status: Home / Self Care | Payer: Self-pay | Source: Home / Self Care | Attending: Ophthalmology

## 2019-11-28 ENCOUNTER — Other Ambulatory Visit: Payer: Self-pay

## 2019-11-28 ENCOUNTER — Ambulatory Visit: Payer: Medicare Other | Admitting: Anesthesiology

## 2019-11-28 ENCOUNTER — Ambulatory Visit
Admission: RE | Admit: 2019-11-28 | Discharge: 2019-11-28 | Disposition: A | Discharge status: Home / Self Care | Payer: Medicare Other | Attending: Ophthalmology | Admitting: Ophthalmology

## 2019-11-28 DIAGNOSIS — I10 Essential (primary) hypertension: Secondary | ICD-10-CM | POA: Diagnosis not present

## 2019-11-28 DIAGNOSIS — H2512 Age-related nuclear cataract, left eye: Secondary | ICD-10-CM | POA: Diagnosis present

## 2019-11-28 DIAGNOSIS — Z79899 Other long term (current) drug therapy: Secondary | ICD-10-CM | POA: Insufficient documentation

## 2019-11-28 DIAGNOSIS — Z7982 Long term (current) use of aspirin: Secondary | ICD-10-CM | POA: Insufficient documentation

## 2019-11-28 DIAGNOSIS — E119 Type 2 diabetes mellitus without complications: Secondary | ICD-10-CM | POA: Insufficient documentation

## 2019-11-28 DIAGNOSIS — Z7984 Long term (current) use of oral hypoglycemic drugs: Secondary | ICD-10-CM | POA: Diagnosis not present

## 2019-11-28 HISTORY — PX: CATARACT EXTRACTION W/PHACO: SHX586

## 2019-11-28 LAB — GLUCOSE, CAPILLARY
Glucose-Capillary: 85 mg/dL (ref 70–99)
Glucose-Capillary: 78 mg/dL (ref 70–99)

## 2019-11-28 SURGERY — PHACOEMULSIFICATION, CATARACT, WITH IOL INSERTION
Anesthesia: Monitor Anesthesia Care | Site: Eye | Laterality: Left

## 2019-11-28 MED ORDER — MIDAZOLAM HCL 2 MG/2ML IJ SOLN
INTRAMUSCULAR | Status: DC | PRN
  Administered 2019-11-28: 1 mg via INTRAVENOUS

## 2019-11-28 MED ORDER — SODIUM HYALURONATE 23 MG/ML IO SOLN
INTRAOCULAR | Status: DC | PRN
  Administered 2019-11-28: 0.6 mL via INTRAOCULAR

## 2019-11-28 MED ORDER — SODIUM HYALURONATE 10 MG/ML IO SOLN
INTRAOCULAR | Status: DC | PRN
  Administered 2019-11-28: 0.55 mL via INTRAOCULAR

## 2019-11-28 MED ORDER — ONDANSETRON HCL 4 MG/2ML IJ SOLN: 4.0000 mg | Freq: Once | INTRAMUSCULAR | Status: DC | PRN

## 2019-11-28 MED ORDER — LIDOCAINE HCL (PF) 2 % IJ SOLN
INTRAOCULAR | Status: DC | PRN
  Administered 2019-11-28: 1 mL via INTRAOCULAR

## 2019-11-28 MED ORDER — ACETAMINOPHEN 325 MG PO TABS: 325.0000 mg | ORAL_TABLET | ORAL | Status: DC | PRN

## 2019-11-28 MED ORDER — EPINEPHRINE PF 1 MG/ML IJ SOLN
INTRAOCULAR | Status: DC | PRN
  Administered 2019-11-28: 106 mL via OPHTHALMIC

## 2019-11-28 MED ORDER — TETRACAINE HCL 0.5 % OP SOLN
1.0000 [drp] | OPHTHALMIC | Status: DC | PRN
  Administered 2019-11-28 (×3): 1 [drp] via OPHTHALMIC

## 2019-11-28 MED ORDER — ACETAMINOPHEN 160 MG/5ML PO SOLN: 325.0000 mg | ORAL | Status: DC | PRN

## 2019-11-28 MED ORDER — ARMC OPHTHALMIC DILATING DROPS
1.0000 "application " | OPHTHALMIC | Status: DC | PRN
  Administered 2019-11-28 (×3): 1 via OPHTHALMIC

## 2019-11-28 MED ORDER — FENTANYL CITRATE (PF) 100 MCG/2ML IJ SOLN
INTRAMUSCULAR | Status: DC | PRN
  Administered 2019-11-28: 50 ug via INTRAVENOUS

## 2019-11-28 MED ORDER — MOXIFLOXACIN HCL 0.5 % OP SOLN
OPHTHALMIC | Status: DC | PRN
  Administered 2019-11-28: 0.2 mL via OPHTHALMIC

## 2019-11-28 SURGICAL SUPPLY — 20 items
CANNULA ANT/CHMB 27G (MISCELLANEOUS) ×2 IMPLANT
CANNULA ANT/CHMB 27GA (MISCELLANEOUS) ×6 IMPLANT
DISSECTOR HYDRO NUCLEUS 50X22 (MISCELLANEOUS) ×3 IMPLANT
GLOVE SURG LX 7.5 STRW (GLOVE) ×2
GLOVE SURG LX STRL 7.5 STRW (GLOVE) ×1 IMPLANT
GLOVE SURG SYN 8.5  E (GLOVE) ×4
GLOVE SURG SYN 8.5 E (GLOVE) ×2 IMPLANT
GLOVE SURG SYN 8.5 PF PI (GLOVE) ×1 IMPLANT
GOWN STRL REUS W/ TWL LRG LVL3 (GOWN DISPOSABLE) ×2 IMPLANT
GOWN STRL REUS W/TWL LRG LVL3 (GOWN DISPOSABLE) ×4
LENS IOL DIOP 24.5 (Intraocular Lens) ×3 IMPLANT
LENS IOL TECNIS MONO 24.5 (Intraocular Lens) IMPLANT
MARKER SKIN DUAL TIP RULER LAB (MISCELLANEOUS) ×3 IMPLANT
PACK DR. KING ARMS (PACKS) ×3 IMPLANT
PACK EYE AFTER SURG (MISCELLANEOUS) ×3 IMPLANT
PACK OPTHALMIC (MISCELLANEOUS) ×3 IMPLANT
SYR 3ML LL SCALE MARK (SYRINGE) ×3 IMPLANT
SYR TB 1ML LUER SLIP (SYRINGE) ×3 IMPLANT
WATER STERILE IRR 250ML POUR (IV SOLUTION) ×3 IMPLANT
WIPE NON LINTING 3.25X3.25 (MISCELLANEOUS) ×3 IMPLANT

## 2019-11-28 NOTE — Anesthesia Procedure Notes (Signed)
Procedure Name: MAC Performed by: Suleika Donavan, CRNA Pre-anesthesia Checklist: Patient identified, Emergency Drugs available, Suction available, Timeout performed and Patient being monitored Patient Re-evaluated:Patient Re-evaluated prior to induction Oxygen Delivery Method: Nasal cannula Placement Confirmation: positive ETCO2       

## 2019-11-28 NOTE — Op Note (Signed)
OPERATIVE NOTE  Toni Moran [id number] 11/28/2019   PREOPERATIVE DIAGNOSIS:  Nuclear sclerotic cataract left eye.  H25.12   POSTOPERATIVE DIAGNOSIS:    Nuclear sclerotic cataract left eye.     PROCEDURE:  Phacoemusification with posterior chamber intraocular lens placement of the left eye   LENS:   Implant Name Type Inv. Item Serial No. Manufacturer Lot No. LRB No. Used Action  LENS IOL DIOP 24.5 - [id number] Intraocular Lens LENS IOL DIOP 24.5 [id number] AMO  Left 1 Implanted      Procedure(s) with comments: CATARACT EXTRACTION PHACO AND INTRAOCULAR LENS PLACEMENT (IOC) LEFT DIABETIC (Left) - 3.17 0:37.1  DCB00 +24.5   ULTRASOUND TIME: 0 minutes 37 seconds.  CDE 3.17   SURGEON:  Willey Blade, MD, MPH   ANESTHESIA:  Topical with tetracaine drops augmented with 1% preservative-free intracameral lidocaine.  ESTIMATED BLOOD LOSS: <1 mL   COMPLICATIONS:  None.   DESCRIPTION OF PROCEDURE:  The patient was identified in the holding room and transported to the operating room and placed in the supine position under the operating microscope.  The left eye was identified as the operative eye and it was prepped and draped in the usual sterile ophthalmic fashion.   A 1.0 millimeter clear-corneal paracentesis was made at the 5:00 position. 0.5 ml of preservative-free 1% lidocaine with epinephrine was injected into the anterior chamber.  The anterior chamber was filled with Healon 5 viscoelastic.  A 2.4 millimeter keratome was used to make a near-clear corneal incision at the 2:00 position.  A curvilinear capsulorrhexis was made with a cystotome and capsulorrhexis forceps.  Balanced salt solution was used to hydrodissect and hydrodelineate the nucleus.   Phacoemulsification was then used in stop and chop fashion to remove the lens nucleus and epinucleus.  The remaining cortex was then removed using the irrigation and aspiration handpiece. Healon was then placed into the capsular bag to  distend it for lens placement.  A lens was then injected into the capsular bag.  The remaining viscoelastic was aspirated.   Wounds were hydrated with balanced salt solution.  The anterior chamber was inflated to a physiologic pressure with balanced salt solution.  Intracameral vigamox 0.1 mL undiltued was injected into the eye and a drop placed onto the ocular surface.  No wound leaks were noted.  The patient was taken to the recovery room in stable condition without complications of anesthesia or surgery  Willey Blade 11/28/2019, 10:59 AM

## 2019-11-28 NOTE — Anesthesia Preprocedure Evaluation (Signed)
Anesthesia Evaluation  Patient identified by MRN, date of birth, ID band Patient awake    Reviewed: Allergy & Precautions, H&P , NPO status , Patient's Chart, lab work & pertinent test results, reviewed documented beta blocker date and time   Airway Mallampati: II  TM Distance: >3 FB Neck ROM: full    Dental  (+) Partial Upper, Lower Dentures   Pulmonary neg pulmonary ROS,    Pulmonary exam normal breath sounds clear to auscultation       Cardiovascular Exercise Tolerance: Good hypertension,  Rhythm:regular Rate:Normal     Neuro/Psych negative neurological ROS  negative psych ROS   GI/Hepatic negative GI ROS, Neg liver ROS,   Endo/Other  diabetes, Type 2  Renal/GU negative Renal ROS  negative genitourinary   Musculoskeletal   Abdominal   Peds  Hematology negative hematology ROS (+)   Anesthesia Other Findings   Reproductive/Obstetrics negative OB ROS                             Anesthesia Physical Anesthesia Plan  ASA: II  Anesthesia Plan: MAC   Post-op Pain Management:    Induction:   PONV Risk Score and Plan: 2 and Treatment may vary due to age or medical condition  Airway Management Planned:   Additional Equipment:   Intra-op Plan:   Post-operative Plan:   Informed Consent: I have reviewed the patients History and Physical, chart, labs and discussed the procedure including the risks, benefits and alternatives for the proposed anesthesia with the patient or authorized representative who has indicated his/her understanding and acceptance.     Dental Advisory Given  Plan Discussed with: CRNA  Anesthesia Plan Comments:         Anesthesia Quick Evaluation

## 2019-11-28 NOTE — H&P (Signed)

## 2019-11-28 NOTE — Anesthesia Postprocedure Evaluation (Signed)
Anesthesia Post Note  Patient: Toni Moran  Procedure(s) Performed: CATARACT EXTRACTION PHACO AND INTRAOCULAR LENS PLACEMENT (IOC) LEFT DIABETIC (Left Eye)     Patient location during evaluation: PACU Anesthesia Type: MAC Level of consciousness: awake and alert Pain management: pain level controlled Vital Signs Assessment: post-procedure vital signs reviewed and stable Respiratory status: spontaneous breathing, nonlabored ventilation, respiratory function stable and patient connected to nasal cannula oxygen Cardiovascular status: stable and blood pressure returned to baseline Postop Assessment: no apparent nausea or vomiting Anesthetic complications: no    Alisa Graff

## 2019-11-28 NOTE — Transfer of Care (Signed)
Immediate Anesthesia Transfer of Care Note  Patient: Toni Moran  Procedure(s) Performed: CATARACT EXTRACTION PHACO AND INTRAOCULAR LENS PLACEMENT (IOC) LEFT DIABETIC (Left Eye)  Patient Location: PACU  Anesthesia Type: MAC  Level of Consciousness: awake, alert  and patient cooperative  Airway and Oxygen Therapy: Patient Spontanous Breathing and Patient connected to supplemental oxygen  Post-op Assessment: Post-op Vital signs reviewed, Patient's Cardiovascular Status Stable, Respiratory Function Stable, Patent Airway and No signs of Nausea or vomiting  Post-op Vital Signs: Reviewed and stable  Complications: No apparent anesthesia complications

## 2019-11-29 ENCOUNTER — Encounter: Payer: Self-pay | Admitting: *Deleted
